# Patient Record
Sex: Male | Born: 1935 | Race: White | Hispanic: No | Marital: Married | Smoking: Never smoker
Health system: Southern US, Community
[De-identification: ages and names within clinical notes are randomized; demographics above are authoritative.]

## PROBLEM LIST (undated history)

## (undated) DIAGNOSIS — F419 Anxiety disorder, unspecified: Secondary | ICD-10-CM

## (undated) DIAGNOSIS — N289 Disorder of kidney and ureter, unspecified: Secondary | ICD-10-CM

## (undated) DIAGNOSIS — I8500 Esophageal varices without bleeding: Secondary | ICD-10-CM

## (undated) DIAGNOSIS — K219 Gastro-esophageal reflux disease without esophagitis: Secondary | ICD-10-CM

## (undated) DIAGNOSIS — F329 Major depressive disorder, single episode, unspecified: Secondary | ICD-10-CM

## (undated) DIAGNOSIS — F32A Depression, unspecified: Secondary | ICD-10-CM

## (undated) DIAGNOSIS — K7581 Nonalcoholic steatohepatitis (NASH): Secondary | ICD-10-CM

## (undated) DIAGNOSIS — G4730 Sleep apnea, unspecified: Secondary | ICD-10-CM

## (undated) DIAGNOSIS — D649 Anemia, unspecified: Secondary | ICD-10-CM

## (undated) DIAGNOSIS — E119 Type 2 diabetes mellitus without complications: Secondary | ICD-10-CM

## (undated) DIAGNOSIS — I10 Essential (primary) hypertension: Secondary | ICD-10-CM

## (undated) HISTORY — PX: TONSILLECTOMY: SUR1361

---

## 1982-01-13 HISTORY — PX: OTHER SURGICAL HISTORY: SHX169

## 1998-06-21 ENCOUNTER — Encounter: Payer: Self-pay | Admitting: Family Medicine

## 1998-06-21 ENCOUNTER — Ambulatory Visit (HOSPITAL_COMMUNITY): Admission: RE | Admit: 1998-06-21 | Discharge: 1998-06-21 | Payer: Self-pay | Admitting: Family Medicine

## 1998-08-24 ENCOUNTER — Ambulatory Visit (HOSPITAL_COMMUNITY): Admission: RE | Admit: 1998-08-24 | Discharge: 1998-08-24 | Payer: Self-pay | Admitting: Cardiology

## 2000-06-11 ENCOUNTER — Encounter: Admission: RE | Admit: 2000-06-11 | Discharge: 2000-06-11 | Payer: Self-pay | Admitting: Family Medicine

## 2000-06-11 ENCOUNTER — Encounter: Payer: Self-pay | Admitting: Family Medicine

## 2001-03-18 ENCOUNTER — Other Ambulatory Visit: Admission: RE | Admit: 2001-03-18 | Discharge: 2001-03-18 | Payer: Self-pay | Admitting: Family Medicine

## 2001-08-02 ENCOUNTER — Ambulatory Visit (HOSPITAL_COMMUNITY): Admission: RE | Admit: 2001-08-02 | Discharge: 2001-08-02 | Payer: Self-pay | Admitting: Family Medicine

## 2001-08-02 ENCOUNTER — Encounter: Payer: Self-pay | Admitting: Family Medicine

## 2001-08-03 ENCOUNTER — Inpatient Hospital Stay (HOSPITAL_COMMUNITY): Admission: EM | Admit: 2001-08-03 | Discharge: 2001-08-09 | Payer: Self-pay | Admitting: Emergency Medicine

## 2001-08-03 ENCOUNTER — Encounter (INDEPENDENT_AMBULATORY_CARE_PROVIDER_SITE_OTHER): Payer: Self-pay | Admitting: *Deleted

## 2001-08-06 ENCOUNTER — Encounter: Payer: Self-pay | Admitting: Internal Medicine

## 2001-08-07 ENCOUNTER — Encounter: Payer: Self-pay | Admitting: Internal Medicine

## 2001-08-17 ENCOUNTER — Ambulatory Visit (HOSPITAL_COMMUNITY): Admission: RE | Admit: 2001-08-17 | Discharge: 2001-08-17 | Payer: Self-pay | Admitting: Family Medicine

## 2001-08-17 ENCOUNTER — Encounter: Payer: Self-pay | Admitting: Family Medicine

## 2001-08-24 ENCOUNTER — Encounter: Payer: Self-pay | Admitting: Gastroenterology

## 2001-08-24 ENCOUNTER — Ambulatory Visit (HOSPITAL_COMMUNITY): Admission: RE | Admit: 2001-08-24 | Discharge: 2001-08-24 | Payer: Self-pay | Admitting: Gastroenterology

## 2001-09-27 ENCOUNTER — Encounter: Payer: Self-pay | Admitting: *Deleted

## 2001-09-27 ENCOUNTER — Ambulatory Visit (HOSPITAL_COMMUNITY): Admission: RE | Admit: 2001-09-27 | Discharge: 2001-09-27 | Payer: Self-pay | Admitting: *Deleted

## 2001-12-06 ENCOUNTER — Encounter: Payer: Self-pay | Admitting: Pulmonary Disease

## 2001-12-06 ENCOUNTER — Ambulatory Visit (HOSPITAL_BASED_OUTPATIENT_CLINIC_OR_DEPARTMENT_OTHER): Admission: RE | Admit: 2001-12-06 | Discharge: 2001-12-06 | Payer: Self-pay | Admitting: Internal Medicine

## 2002-02-24 ENCOUNTER — Encounter (INDEPENDENT_AMBULATORY_CARE_PROVIDER_SITE_OTHER): Payer: Self-pay

## 2002-02-24 ENCOUNTER — Ambulatory Visit (HOSPITAL_COMMUNITY): Admission: RE | Admit: 2002-02-24 | Discharge: 2002-02-24 | Payer: Self-pay | Admitting: Oncology

## 2002-04-28 ENCOUNTER — Ambulatory Visit (HOSPITAL_COMMUNITY): Admission: RE | Admit: 2002-04-28 | Discharge: 2002-04-28 | Payer: Self-pay | Admitting: Gastroenterology

## 2002-04-28 ENCOUNTER — Encounter: Payer: Self-pay | Admitting: Gastroenterology

## 2002-04-28 ENCOUNTER — Encounter (INDEPENDENT_AMBULATORY_CARE_PROVIDER_SITE_OTHER): Payer: Self-pay | Admitting: *Deleted

## 2003-03-07 ENCOUNTER — Ambulatory Visit (HOSPITAL_COMMUNITY): Admission: RE | Admit: 2003-03-07 | Discharge: 2003-03-07 | Payer: Self-pay | Admitting: Gastroenterology

## 2003-05-18 IMAGING — CT CT ABDOMEN W/ CM
1 series · 15 of 32 positions shown, 19 images · IV contrast (omnipaque)
Comparison: none

FINDINGS
CLINICAL DATA: SHORTNESS OF BREATH.  HISTORY OF CHF.  PERSISTENT FEVER.  PLEURAL EFFUSION.
QUESTION OF CIRRHOTIC LIVER ON CHEST CT SCAN.
CT SCAN OF THE ABDOMEN WITH IV CONTRAST
AXIAL CUTS WERE OBTAINED FOLLOWING ORAL GASTROGRAFIN INGESTION AND DURING IV INFUSION OF 150 CC OF
OMNIPAQUE 300.  IT WAS APPROPRIATE TO UTILIZE OMNIPAQUE 300 BECAUSE OF REPORTED HISTORY OF DIABETES
AND HYPERTENSION.
BILATERAL PLEURAL EFFUSIONS LARGER ON THE LEFT THAN ON THE RIGHT.  NO PERICARDIAL EFFUSION.  LEFT
LOWER LOBAR ATELECTASIS.  PARTIAL RIGHT LOWER LOBAR ATELECTASIS.  CALCIFIC DENSITY IS NOTED IN THE
LEFT CORONARY ARTERY SYSTEM INCLUDING LAD AND CIRCUMFLEX BRANCHES.  FAIRLY SUBTLE FINDING IS
LOBULATED CONTOUR OF THE LIVER BEST APPRECIATED ALONG THE POSTERIOR ASPECT OF THE RIGHT HEPATIC
LOBE.  MILD CAUDATE LOBE HYPERTROPHY.  MILD HEPATOMEGALY.  MILD SPLENOMEGALY.  THERE MAY BE MINIMAL
FATTY INFILTRATION OF THE LIVER.  SPLENIC VEIN STRUCTURES APPEAR SOMEWHAT PROMINENT IN CALIBER.
OVERALL I FEEL THAT THE FINDINGS ARE COMPATIBLE WITH CIRRHOSIS WITH PORTAL HYPERTENSION.  NO
EVIDENCE OF VENOUS THROMBOSIS.  NO ASCITES.  I AM CONCERNED THAT THE PANCREATIC HEAD IS ABNORMALLY
ENLARGED POSSIBLY DUE TO PANCREATITIS ALTHOUGH PANCREATIC TUMOR CANNOT BE EXCLUDED WITH CERTAINTY.
CORRELATE WITH AMYLASE/LIPASE.  SMALL AIR FILLED STRUCTURE ALONG THE POSTEROLATERAL ASPECT OF THE
PANCREATIC HEAD MAY REPRESENT A SMALL AMOUNT OF AIR IN THE BILIARY DUCT OR POSSIBLY A DUODENAL
DIVERTICULUM.  SMALL FOCAL DENSITY IS NOTED AT THAT SITE AS WELL AND MAY REPRESENT GASTROGRAFIN IN
A DUODENAL DIVERTICULUM OR POSSIBLY IN THE DISTAL DUCT.  NO DEFINITE DUCTAL STONES.
IMPRESSION
CIRRHOSIS WITH PORTAL HYPERTENSION.  SUSPICION FOR ENLARGED PANCREATIC HEAD.  PLEURAL EFFUSIONS.
LEFT LOWER LOBAR ATELECTASIS.  CORONARY ARTERY DISEASE.
PELVIC CT SCAN WITH IV CONTRAST
AXIAL CUTS WERE OBTAINED FOLLOWING ORAL GASTROGRAFIN INGESTION AND IV INFUSION OF 100 CC OF
OMNIPAQUE 300.  SIGMOID COLON DIVERTICULA.  LARGE AMOUNT OF STOOL IN THE COLON.  NO DEFINITE
EVIDENCE OF DIVERTICULITIS.  UNREMARKABLE APPEARANCE OF THE BLADDER AND PROSTATE.
SIGMOID DIVERTICULAR DISEASE.  OTHERWISE NEGATIVE PELVIC CT SCAN.

[Series 2: abd pelvis · axial · 0.80mm/px · z∈[-455,-30]mm · 15 of 125 slices shown, 19 images]
[im 9/125  soft-tissue]
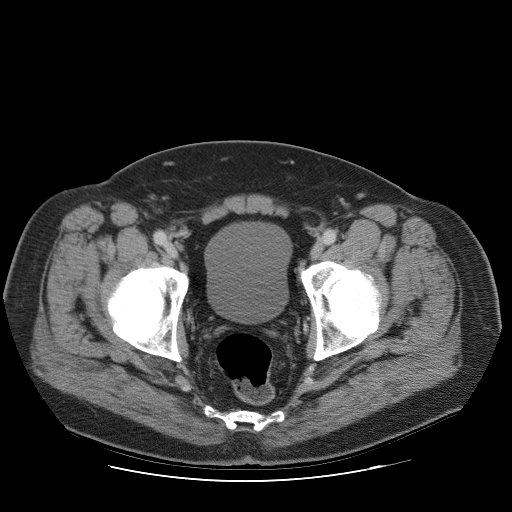
[im 9/125  bone]
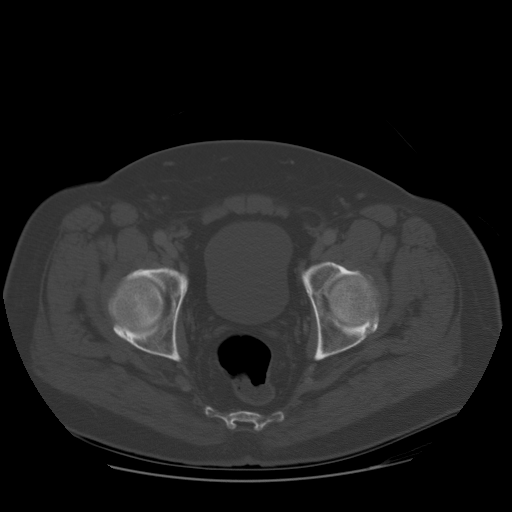
[im 17/125  soft-tissue]
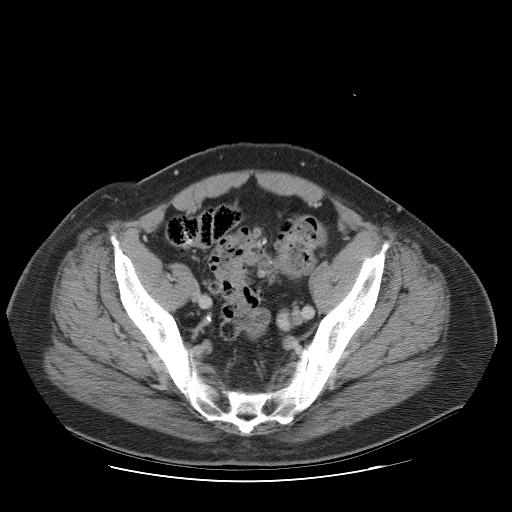
[im 25/125  soft-tissue]
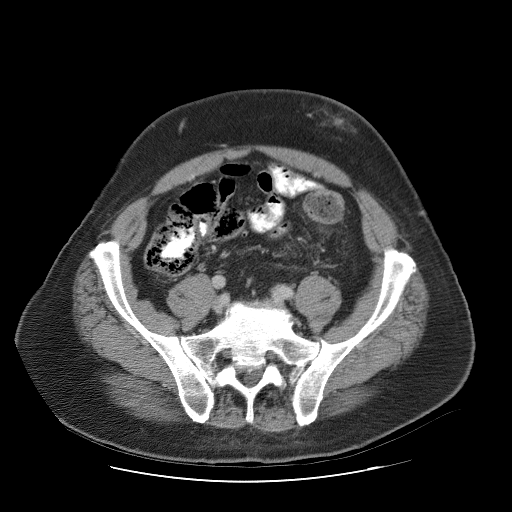
[im 37/125  soft-tissue]
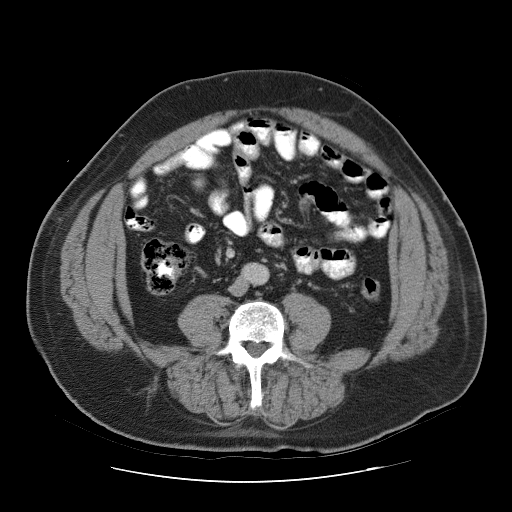
[im 45/125  soft-tissue]
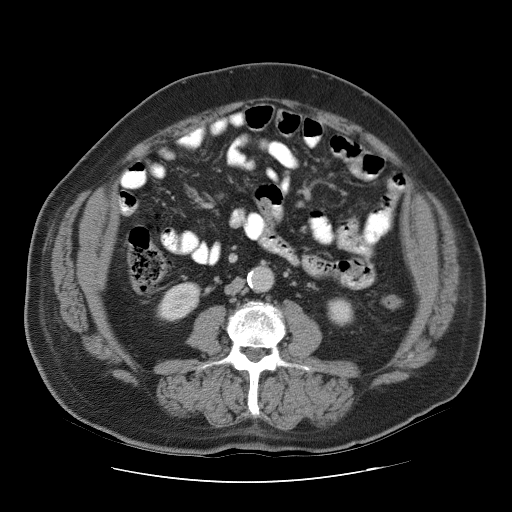
[im 53/125  soft-tissue]
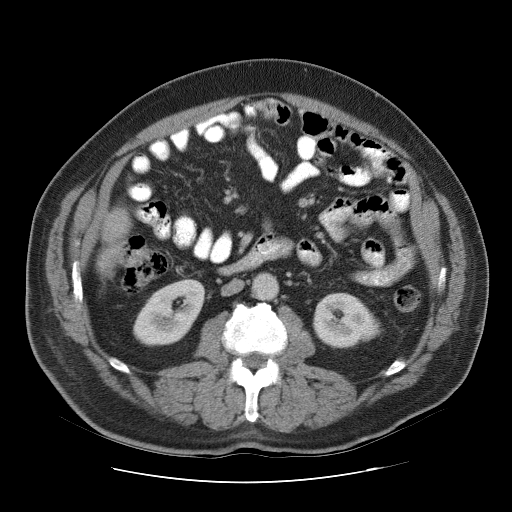
[im 65/125  soft-tissue]
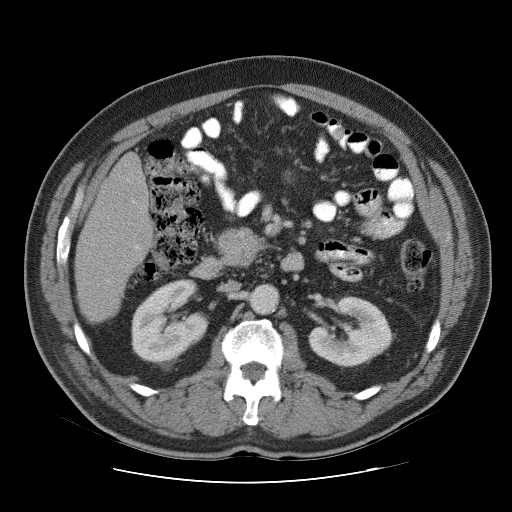
[im 73/125  soft-tissue]
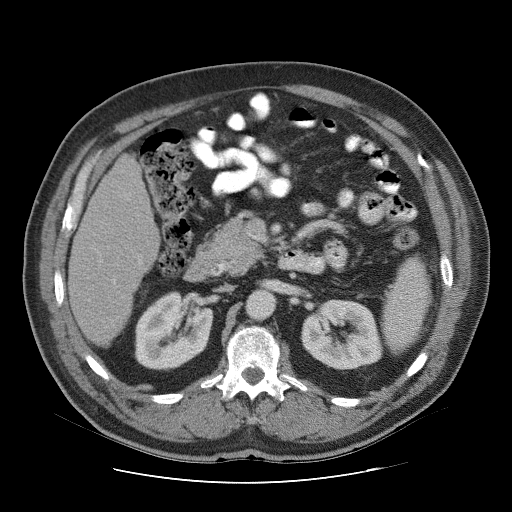
[im 81/125  soft-tissue]
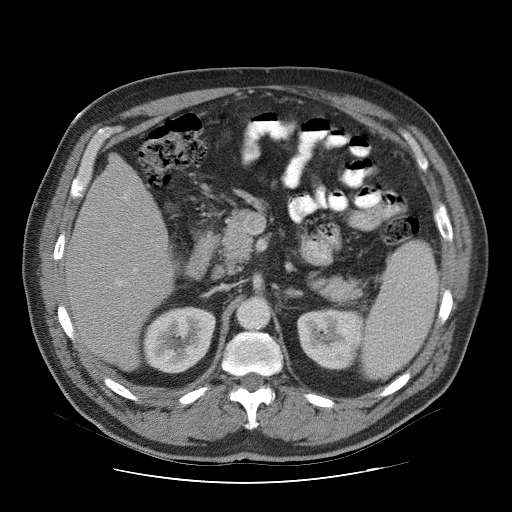
[im 81/125  bone]
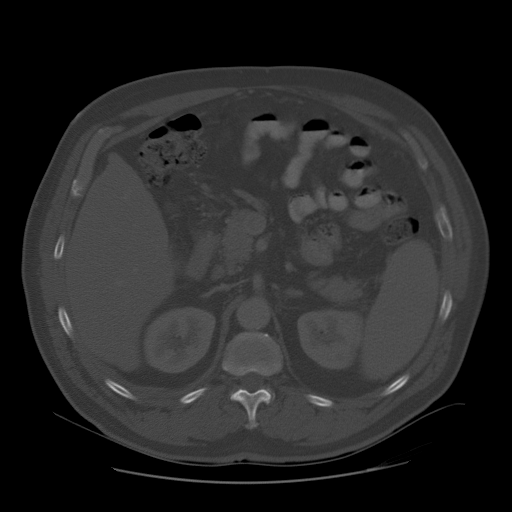
[im 89/125  soft-tissue]
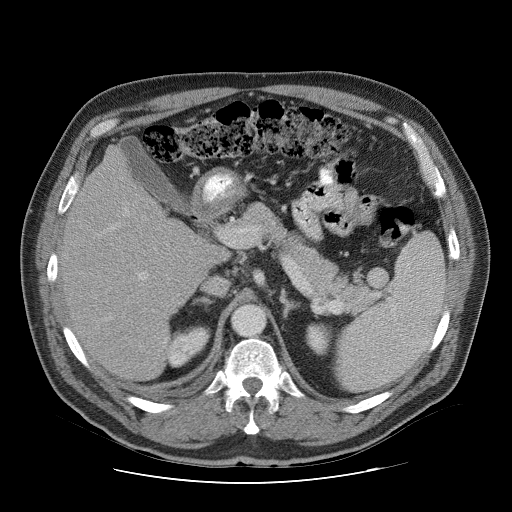
[im 101/125  soft-tissue]
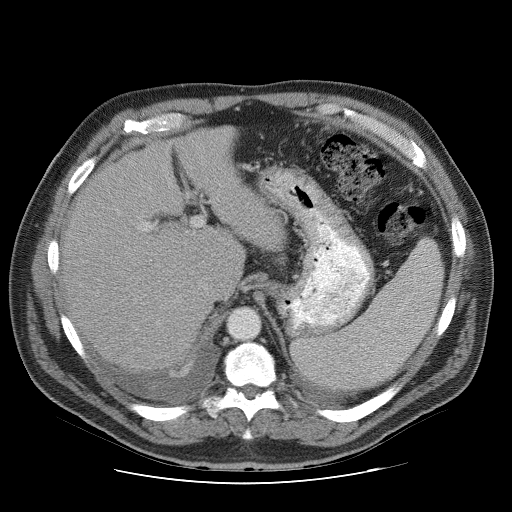
[im 109/125  soft-tissue]
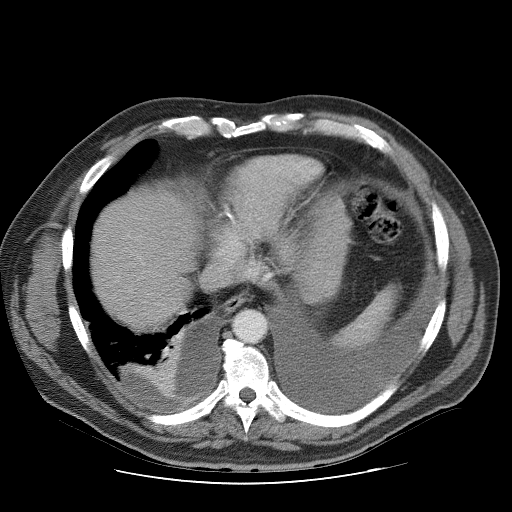
[im 109/125  lung]
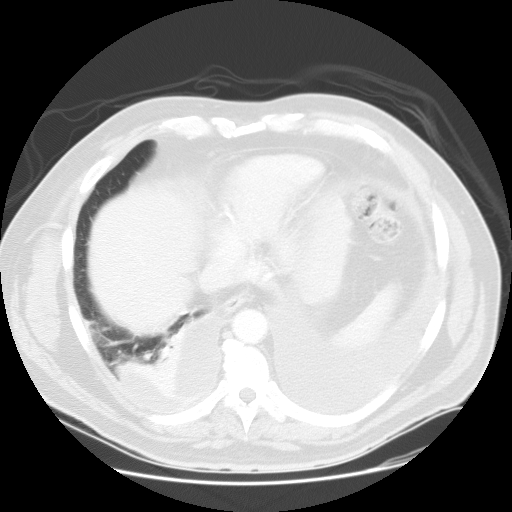
[im 113/125  lung]
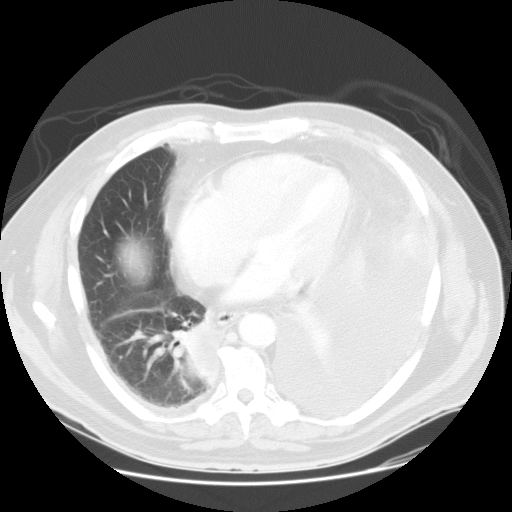
[im 117/125  soft-tissue]
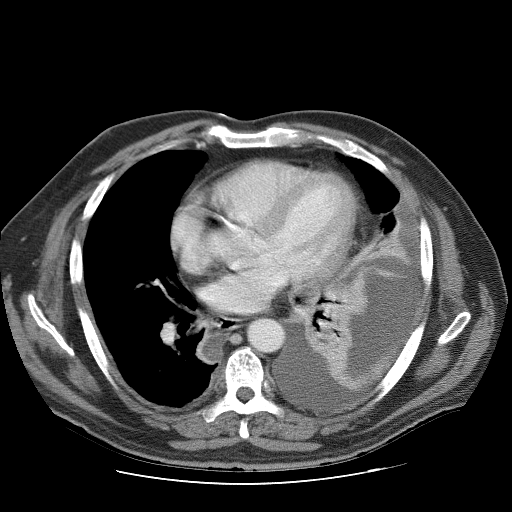
[im 117/125  lung]
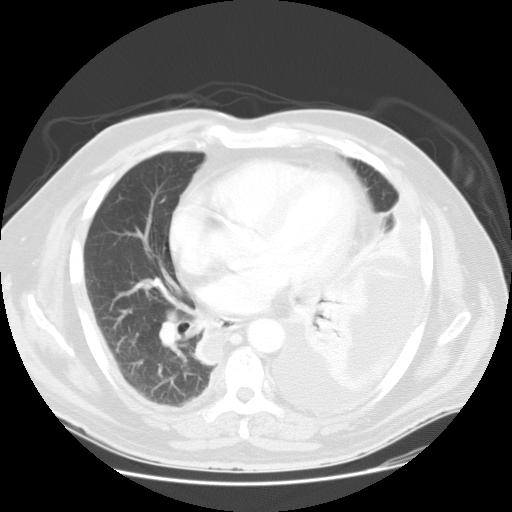
[im 121/125  lung]
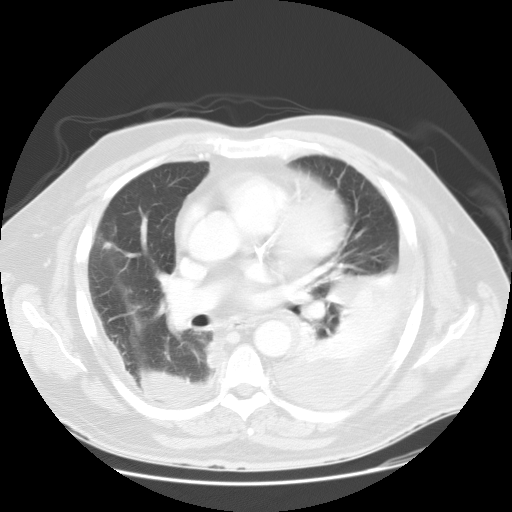

[15 of 32 positions shown; findings below may reference images not displayed]

## 2003-09-26 ENCOUNTER — Encounter: Admission: RE | Admit: 2003-09-26 | Discharge: 2003-09-26 | Payer: Self-pay | Admitting: Gastroenterology

## 2004-02-26 ENCOUNTER — Ambulatory Visit: Payer: Self-pay | Admitting: Oncology

## 2004-08-26 ENCOUNTER — Ambulatory Visit: Payer: Self-pay | Admitting: Oncology

## 2005-02-06 ENCOUNTER — Encounter: Admission: RE | Admit: 2005-02-06 | Discharge: 2005-02-06 | Payer: Self-pay | Admitting: Gastroenterology

## 2005-02-25 ENCOUNTER — Ambulatory Visit: Payer: Self-pay | Admitting: Oncology

## 2005-08-11 ENCOUNTER — Ambulatory Visit: Payer: Self-pay | Admitting: Cardiology

## 2005-08-15 ENCOUNTER — Ambulatory Visit: Payer: Self-pay

## 2005-09-09 ENCOUNTER — Ambulatory Visit: Payer: Self-pay | Admitting: Cardiology

## 2005-09-18 ENCOUNTER — Ambulatory Visit: Payer: Self-pay | Admitting: Internal Medicine

## 2005-09-24 ENCOUNTER — Ambulatory Visit (HOSPITAL_BASED_OUTPATIENT_CLINIC_OR_DEPARTMENT_OTHER): Admission: RE | Admit: 2005-09-24 | Discharge: 2005-09-24 | Payer: Self-pay | Admitting: Internal Medicine

## 2005-09-24 ENCOUNTER — Encounter: Payer: Self-pay | Admitting: Pulmonary Disease

## 2005-10-02 ENCOUNTER — Ambulatory Visit: Payer: Self-pay | Admitting: Pulmonary Disease

## 2005-11-06 ENCOUNTER — Ambulatory Visit: Payer: Self-pay | Admitting: Internal Medicine

## 2005-11-07 ENCOUNTER — Ambulatory Visit: Payer: Self-pay | Admitting: Internal Medicine

## 2006-07-24 ENCOUNTER — Ambulatory Visit (HOSPITAL_COMMUNITY): Admission: RE | Admit: 2006-07-24 | Discharge: 2006-07-24 | Payer: Self-pay | Admitting: Family Medicine

## 2006-09-24 ENCOUNTER — Emergency Department (HOSPITAL_COMMUNITY): Admission: EM | Admit: 2006-09-24 | Discharge: 2006-09-24 | Payer: Self-pay | Admitting: Emergency Medicine

## 2007-04-02 ENCOUNTER — Observation Stay (HOSPITAL_COMMUNITY): Admission: EM | Admit: 2007-04-02 | Discharge: 2007-04-03 | Payer: Self-pay | Admitting: Emergency Medicine

## 2007-04-02 ENCOUNTER — Ambulatory Visit: Payer: Self-pay | Admitting: Cardiology

## 2007-04-03 ENCOUNTER — Encounter (INDEPENDENT_AMBULATORY_CARE_PROVIDER_SITE_OTHER): Payer: Self-pay | Admitting: Internal Medicine

## 2007-04-12 ENCOUNTER — Ambulatory Visit: Payer: Self-pay | Admitting: Cardiology

## 2007-07-06 DIAGNOSIS — I359 Nonrheumatic aortic valve disorder, unspecified: Secondary | ICD-10-CM | POA: Insufficient documentation

## 2007-07-06 DIAGNOSIS — E785 Hyperlipidemia, unspecified: Secondary | ICD-10-CM | POA: Insufficient documentation

## 2007-07-06 DIAGNOSIS — E40 Kwashiorkor: Secondary | ICD-10-CM | POA: Insufficient documentation

## 2007-07-06 DIAGNOSIS — K7689 Other specified diseases of liver: Secondary | ICD-10-CM | POA: Insufficient documentation

## 2007-07-06 DIAGNOSIS — E669 Obesity, unspecified: Secondary | ICD-10-CM | POA: Insufficient documentation

## 2007-07-07 ENCOUNTER — Ambulatory Visit: Payer: Self-pay | Admitting: Pulmonary Disease

## 2007-07-07 DIAGNOSIS — G4733 Obstructive sleep apnea (adult) (pediatric): Secondary | ICD-10-CM

## 2008-02-10 ENCOUNTER — Ambulatory Visit (HOSPITAL_COMMUNITY): Admission: RE | Admit: 2008-02-10 | Discharge: 2008-02-10 | Payer: Self-pay | Admitting: Gastroenterology

## 2008-02-17 ENCOUNTER — Ambulatory Visit (HOSPITAL_COMMUNITY): Admission: RE | Admit: 2008-02-17 | Discharge: 2008-02-17 | Payer: Self-pay | Admitting: Gastroenterology

## 2008-03-09 ENCOUNTER — Ambulatory Visit (HOSPITAL_COMMUNITY): Admission: RE | Admit: 2008-03-09 | Discharge: 2008-03-09 | Payer: Self-pay | Admitting: Gastroenterology

## 2009-03-12 ENCOUNTER — Encounter: Admission: RE | Admit: 2009-03-12 | Discharge: 2009-03-12 | Payer: Self-pay | Admitting: Gastroenterology

## 2009-06-12 ENCOUNTER — Telehealth: Payer: Self-pay | Admitting: Pulmonary Disease

## 2009-06-13 ENCOUNTER — Ambulatory Visit: Payer: Self-pay | Admitting: Pulmonary Disease

## 2009-07-11 ENCOUNTER — Encounter: Payer: Self-pay | Admitting: Pulmonary Disease

## 2009-08-06 ENCOUNTER — Encounter: Payer: Self-pay | Admitting: Pulmonary Disease

## 2010-02-12 NOTE — Letter (Signed)
Summary: CMN request for CPAP Supplies/[facility]  CMN request for CPAP Supplies/[facility]   Imported By: Sherian Rein 07/18/2009 13:18:34  _____________________________________________________________________  External Attachment:    Type:   Image     Comment:   External Document

## 2010-02-12 NOTE — Letter (Signed)
Summary: LMN/[facility]  LMN/[facility]   Imported By: Lester Monmouth 06/19/2009 11:54:17  _____________________________________________________________________  External Attachment:    Type:   Image     Comment:   External Document

## 2010-02-12 NOTE — Progress Notes (Signed)
Summary: prescript  Phone Note Call from Patient   Caller: Spouse Call For: [name] Summary of Call: need prescript for humidifer for c pap machine sent to [facility] Initial call taken by: Rickard Patience,  Jun 12, 2009 10:44 AM  Follow-up for Phone Call        Pt last seen 2009, appt scheduled 06/13/2009 with [facility] @ 11 am. Zackery Barefoot CMA  Jun 12, 2009 11:35 AM

## 2010-02-12 NOTE — Assessment & Plan Note (Signed)
Summary: rov for osa, cpap issues.   CC:  Pt is here for an overdue f/u appt . Pt was last seen June 2009.   Pt states he wears his cpap machine every night.  Approx 6 hours per night.  Pt states he also wears cpap while taking naps during the day.  Pt request rx for a new humidifier.  Marland Kitchen  History of Present Illness: the pt comes in today for f/u of his known osa.  He has not been seen since 2009, but at least is wearing cpap compliantly.  He is having issues with his machine not starting up on occasion, and reports that his humidifier is malfunctioning as well.  His current machine is near 73 yrs old, the typical life of a cpap machine.  He is having no issue with mask fit or leak.  He feels that he is sleeping well, and is satisfied with his daytime alertness.  Current Medications (verified): 1)  Fish Oil 1000 Mg  Caps (Omega-3 Fatty Acids) .Marland Kitchen.. 1 By Mouth Two Times A Day 2)  Multivitamins   Tabs (Multiple Vitamin) .Marland Kitchen.. 1 By Mouth Daily 3)  Bayer Aspirin 325 Mg  Tabs (Aspirin) .Marland Kitchen.. 1 By Mouth Daily 4)  Cozaar 100 Mg Tabs (Losartan Potassium) .... Take 1 Tablet By Mouth Once A Day 5)  Gemfibrozil 600 Mg  Tabs (Gemfibrozil) .Marland Kitchen.. 1 By Mouth Two Times A Day 6)  Venlafaxine Hcl 75 Mg  Tabs (Venlafaxine Hcl) .Marland Kitchen.. 1 By Mouth Two Times A Day 7)  Lasix 40 Mg  Tabs (Furosemide) .... Take 1 Tablet By Mouth Once A Day 8)  Ketoconazole 2 %  Crea (Ketoconazole) .... As Directed 9)  Eucerin   Crea (Skin Protectants, Misc.) .... As Directed 10)  Amiloride Hcl 5 Mg  Tabs (Amiloride Hcl) .... Take 1 Tablet By Mouth Once A Day ( Unsure of Dosage...either 5mg  or 10mg ) 11)  Levemir 100 Unit/ml Soln (Insulin Detemir) .... Use As Directed 12)  Align  Caps (Probiotic Product) .... Take 1 Tablet By Mouth Once A Day 13)  Protonix 40 Mg Tbec (Pantoprazole Sodium) .... Take 1 Tablet By Mouth Once A Day  Allergies (verified): No Known Drug Allergies  Review of Systems  The patient denies shortness of breath with  activity, shortness of breath at rest, productive cough, non-productive cough, coughing up blood, chest pain, irregular heartbeats, acid heartburn, indigestion, loss of appetite, weight change, abdominal pain, difficulty swallowing, sore throat, tooth/dental problems, headaches, nasal congestion/difficulty breathing through nose, sneezing, itching, ear ache, anxiety, depression, hand/feet swelling, joint stiffness or pain, rash, change in color of mucus, and fever.    Vital Signs:  Patient profile:   75 year old male Height:      68 inches Weight:      233.31 pounds BMI:     35.60 O2 Sat:      96 % on Room air Temp:     97.6 degrees F oral Pulse rate:   57 / minute BP sitting:   118 / 66  (left arm) Cuff size:   large  Vitals Entered By: Arman Filter LPN (June 13, 1608 11:16 AM)  O2 Flow:  Room air CC: Pt is here for an overdue f/u appt . Pt was last seen June 2009.   Pt states he wears his cpap machine every night.  Approx 6 hours per night.  Pt states he also wears cpap while taking naps during the day.  Pt request rx for  a new humidifier.   Comments Medications reviewed with patient Arman Filter LPN  June 13, 1608 11:17 AM    Physical Exam  General:  ow male in nad Nose:  no skin breakdown or pressure necrosis from cpap mask.  Nares are patent with no crusting. Mouth:  clear Lungs:  clear Extremities:  mild edema with varicosities, no cyanosis Neurologic:  alert, not sleepy,moves all 4.   Impression & Recommendations:  Problem # 1:  OBSTRUCTIVE SLEEP APNEA (ICD-327.23) the pt has not been seen in 2 years, and is having issues with his cpap machine.  I suspect he needs a new device by his description, and will try to go thru insurance company to authorize this.  I have also reminded him to keep up with cpap supplies and mask changes, and to let us know if having issues.  Finally, I have asked him to work on weight loss.  Medications Added to Medication List This Visit: 1)   Cozaar 100 Mg Tabs (Losartan potassium) .... Take 1 tablet by mouth once a day 2)  Levemir 100 Unit/ml Soln (Insulin detemir) .... Use as directed 3)  Align Caps (Probiotic product) .... Take 1 tablet by mouth once a day 4)  Protonix 40 Mg Tbec (Pantoprazole sodium) .... Take 1 tablet by mouth once a day  Other Orders: Est. Patient Level IV (96045) DME Referral (DME)  Patient Instructions: 1)  will see if we can get you a new machine, or at least a new humidifier. 2)  work on weight loss 3)  followup with me in one year, but call if we are not getting your equipment issues sorted out.

## 2010-02-20 ENCOUNTER — Other Ambulatory Visit: Payer: Self-pay | Admitting: Gastroenterology

## 2010-02-20 DIAGNOSIS — K7460 Unspecified cirrhosis of liver: Secondary | ICD-10-CM

## 2010-02-26 ENCOUNTER — Ambulatory Visit
Admission: RE | Admit: 2010-02-26 | Discharge: 2010-02-26 | Disposition: A | Discharge status: Home / Self Care | Payer: BC Managed Care – PPO | Source: Ambulatory Visit | Attending: Gastroenterology | Admitting: Gastroenterology

## 2010-02-26 DIAGNOSIS — K7460 Unspecified cirrhosis of liver: Secondary | ICD-10-CM

## 2010-04-30 LAB — TYPE AND SCREEN
ABO/RH(D): A POS
Antibody Screen: NEGATIVE

## 2010-04-30 LAB — DIFFERENTIAL
Basophils Absolute: 0 10*3/uL (ref 0.0–0.1)
Lymphocytes Relative: 25 % (ref 12–46)
Neutro Abs: 1.5 10*3/uL — ABNORMAL LOW (ref 1.7–7.7)
Neutrophils Relative %: 53 % (ref 43–77)

## 2010-04-30 LAB — CBC
HCT: 35.6 % — ABNORMAL LOW (ref 39.0–52.0)
MCV: 93.8 fL (ref 78.0–100.0)
Platelets: 99 10*3/uL — ABNORMAL LOW (ref 150–400)
WBC: 2.9 10*3/uL — ABNORMAL LOW (ref 4.0–10.5)

## 2010-04-30 LAB — COMPREHENSIVE METABOLIC PANEL
Albumin: 3.1 g/dL — ABNORMAL LOW (ref 3.5–5.2)
BUN: 17 mg/dL (ref 6–23)
CO2: 24 mEq/L (ref 19–32)
Chloride: 102 mEq/L (ref 96–112)
Creatinine, Ser: 1.08 mg/dL (ref 0.4–1.5)
GFR calc non Af Amer: >60 mL/min (ref >60)
Glucose, Bld: 168 mg/dL — ABNORMAL HIGH (ref 70–99)
Total Bilirubin: 0.9 mg/dL (ref 0.3–1.2)

## 2010-04-30 LAB — ABO/RH: ABO/RH(D): A POS

## 2010-05-28 NOTE — Op Note (Signed)
NAME:  [name], [name]               ACCOUNT NO.:  [id number]   MEDICAL RECORD NO.:  [id number]          PATIENT TYPE:  AMB   LOCATION:  ENDO                         FACILITY:  [facility]   PHYSICIAN:  Bernette Redbird, M.D.   DATE OF BIRTH:  03-03-1935   DATE OF PROCEDURE:  03/09/2008  DATE OF DISCHARGE:                               OPERATIVE REPORT   PROCEDURE:  Upper endoscopy.   INDICATION:  A 75 year old gentleman with nonalcoholic steatohepatitis  for updated evaluation of the status of his esophageal varices, with  possible banding if they are of sufficient size to warrant that.   FINDINGS:  Small to medium distal esophageal varices, not banded.  Significant portal gastropathy.   PROCEDURE IN DETAIL:  The nature, purpose, risks of the procedure had  been discussed with the patient who provided written consent.  Sedation  was propofol by anesthesia.  There was a moderate amount of desaturation  during the procedure with obstructive sounding upper airway sounds,  consistent with obstructive sleep apnea, but he was not clinically  unstable throughout the procedure.   The Pentax adult video endoscope was passed under direct vision,  entering the esophagus without significant difficulty.   The proximal esophagus was normal but the distal esophagus did have  small varices.  The size of them varied according to the degree of  insufflation and relaxation of the esophagus.  In particular, at the 2  o'clock position just above the lower esophageal sphincter, one varix  was quite prominent and appeared to be about 3+ in size when the  esophagus was contracted, but it flattened out to 1 or 2+ (small) size  with relaxation of the esophagus and with further insufflation, so I  elected not to band.  The varices probably extended about 4 cm up the  esophagus from the level of the lower esophageal sphincter.   The stomach contained no blood or coffee-ground material but had patchy  erythema and  a few minimal erosions.  The overall picture was suggestive  of portal gastropathy, with a sort of mosaic pattern to the mucosa.  The  erosive changes are attributed to the fact that the patient is on  aspirin and apparently is not currently on proton pump inhibitor  therapy.  The pylorus, duodenal bulb and second duodenum looked normal.  A retroflexed view of the cardia did not show any evidence of gastric  varices or other abnormalities other than portal gastropathy, and the  larynx looked normal during removal of the scope.   The patient tolerated the procedure well and there were no apparent  complications.   IMPRESSION:  1. Small, nonbleeding esophageal varices, not significantly changed in      size from the time of his last endoscopy 2 years ago.  If anything,      they might even be a little bit smaller than before.  2. Portal gastropathy.  3. Mild erosive gastritis consistent with aspirin exposure.   PLAN:  1. Proton pump inhibitor therapy to counter the effect of aspirin on      the stomach lining.  2.  Repeat endoscopy in 1 year to monitor the varices.           ______________________________  Bernette Redbird, M.D.     RB/MEDQ  D:  03/09/2008  T:  03/10/2008  Job:  [id number]   cc:   Dr Bonnee Quin  fax [phone]   Holley Bouche, M.D.  Fax: [phone]

## 2010-05-28 NOTE — Assessment & Plan Note (Signed)
[facility]                            CARDIOLOGY OFFICE NOTE   NAME:[name], [name]                      MRN:          [id number]  DATE:04/12/2007                            DOB:          11-16-1935    Mr. Derousse has mild aortic valvular disease.  He had had an episode of  hypotension and was hospitalized at [facility].  There is a very careful  and complete discharge summary.  It was felt that he did have syncope or  presyncope and it was probably related to some decrease in blood  pressure.  He does have some aortic stenosis but this is not the basis  of any severe ischemia alone.  He has had fevers and he has had a  careful workup and the exact etiology is not clear.  He has had an echo  done and read by [place].  This study was done on April 03, 2007.  The study  shows that he has good left ventricular function with an ejection  fraction of 55-65%.  There is calcification of the right coronary cusp.  The findings are compatible with mild aortic stenosis.   In the office today, he has not had any more shortness of breath. There  has been no more syncope.  The patient is here with his wife.  He is  oriented to person, time and place.   PAST MEDICAL HISTORY:  No known drug allergies.   MEDICATIONS:  Fish oil, multivitamin, aspirin, Avapro, propranolol,  gemfibrozil, Niaspan, Lasix.  Metformin, glyburide, Xifaxan and  colchicine.   OTHER MEDICAL PROBLEMS:  See the list below.   REVIEW OF SYSTEMS:  He feels weak but he is improving.  Otherwise his  review of systems is negative.   PHYSICAL EXAMINATION:  Weight is 229 pounds which actually has decreased  on our scale over the years.  Blood pressure is 140/80 with a pulse of  95.  The patient is oriented to person, time and place.  Affect is normal.  HEENT:  Reveals no xanthelasma.  He has normal extraocular motion.  There are no carotid bruits.  There is no jugular venous distention.  LUNGS:  Clear.   Respiratory effort is not labored.  CARDIAC:  S1 with an S2.  There is a systolic murmur.  ABDOMEN:  Obese but soft.  He has no significant peripheral edema.   EKG reveals a sinus rhythm.   PROBLEM LIST:  1. Recent syncopal episode that is probably multifactorial.  He does      have some mild aortic stenosis but he did not have syncope on the      basis of aortic stenosis alone.  2. Some hypoxia that was multifactorial.  3. Diabetes.  4. Protein calorie malnutrition.  5. Steatohepatitis.  6. Hyperlipidemia.  7. Obesity.  8. Recent discomfort in his foot thought to be gout.  9. Pancytopenia historically related to cirrhosis and hypersplenism.  10.History of a GI bleed in the past.  11.Episodes in 2003 and 2004 of pleural effusion with no definite      diagnoses.  12.Difficulty  taking Elavil in the past.  13.History of some depression.  14.Ongoing generalized fatigue.  15.Recent fevers which have been evaluated and continued to be      evaluated.   Today, I carefully reviewed the patient's hospital information.  I had  also reviewed his 2-D echo.  I have discussed his overall situation with  the patient and his wife.  At this point,  he does have mild aortic  stenosis.  I believe it is not playing a significant role in his overall  status.  I am not recommending any further cardiac workup at this point.  I do suggest that he have a follow-up echo and a visit with me in 1  year.     Luis Abed, MD, [facility]  Electronically Signed    JDK/MedQ  DD: 04/12/2007  DT: 04/12/2007  Job #: [id number]   cc:   Melida Quitter, M.D.  Coral Ceo, MD @ [facility]  Bernette Redbird, M.D.  Demetria Pore. Coral Spikes, M.D.

## 2010-05-28 NOTE — Discharge Summary (Signed)
NAME:  [name], [name] NO.:  [id number]   MEDICAL RECORD NO.:  [id number]          PATIENT TYPE:  OBV   LOCATION:  1438                         FACILITY:  [facility]   PHYSICIAN:  Hollice Espy, M.D.DATE OF BIRTH:  [birth year]   DATE OF ADMISSION:  04/02/2007  DATE OF DISCHARGE:  04/03/2007                               DISCHARGE SUMMARY   PRIMARY CARE PHYSICIAN:  Dr. Leonides Sake.   CARDIOLOGIST:  Dr. Myrtis Ser, [facility].   GASTROENTEROLOGIST:  Dr. Matthias Hughs, [facility].   He also sees Dr. Coral Ceo of hepatology over at [facility].   DISCHARGE DIAGNOSES:  1. Syncope felt to be secondary to decreased blood pressure.  2. Episodes of hypotension, suspected possible aortic stenosis.  3. Hypoxia, multifactorial.  May be from aortic stenosis and/or sleep      apnea.  4. Diabetes mellitus with episodes of hyperglycemia.  5. Protein-calorie malnutrition from acute decreased appetite and      chronic liver disease.  6. Steatohepatitis.  7. Hyperlipidemia.  8. Obesity.  9. History of medical noncompliance.   DISCHARGE MEDICATIONS:  The patient is being advised to stop all of his  blood pressure medications.  These included Avapro 150 daily,  propranolol 20 mg p.o. b.i.d., Norvasc 5 p.o. daily, and to decrease his  Lasix from 40 b.i.d. to 20 b.i.d.   He will continue on the rest of his medications.  His full list  includes:  1. Fish oil b.i.d.  2. Multivitamin daily.  3. Aspirin 325 daily.  4. Lopid 600 b.i.d.  5. Niacin 500 daily.  6. Effexor 75 b.i.d.  7. Metformin 500 t.i.d.  8. Glyburide 5 daily.  9. Crestor a half a pill daily.  10.Xifaxan six pills a day.  11.Continue his topical creams:  Ketoconazole, Lotrisone and Eucerin.   HOSPITAL COURSE:  The patient is a 75 year old white male with past  medical history of steatohepatitis, obesity, obstructive sleep apnea,  diabetes mellitus and hypertension who presented with a syncopal  episode.   The patient recently been evaluated, had a recent attack of  gout, had recently stopped his medications briefly, and also been having  unexplained fevers, all of which were possible contributing factors.   1. In regard to the patient's episode of hypotension.  Reportedly he      was told by paramedics his blood pressure was low after his      syncopal event.  When he was brought here, initially his blood      pressures were moderate in the 110s.  His blood pressure medicine      was not continued.  He was put on low-dose IV fluids at 50 mL an      hour.  During his hospital course his blood pressure stayed around      anywhere from 90 to 100 at best.  This is off of his medications      and only in the afternoon did it increase slightly to 130.  His D-      dimer was elevated on admission but the CT scan of his  chest ended      up being negative for PE.  It did note some calcifications in the      area of the aortic valve and there was a concern about the      possibility of aortic stenosis.  I ordered an echocardiogram to be      done which is still pending and will be done prior to the patient's      discharge, which will be followed up by [facility].  When I      talked to the patient about the possibility of aortic stenosis this      evening, he was reminded that he had previously once seen a      cardiologist and told he might have some aortic stenosis.  He did      not pass on this information to me earlier or to his wife.  For      now, given the fact that he is otherwise stable and doing well, I      am advising him to after, his completion of his echocardiogram, he      will follow up next week with [facility] with the results.      In the meantime, I am stopping his blood pressure medications.  In      review of the literature there is some possible evidence that in      aortic stenosis, which we have not yet confirmed, an ACE inhibitor      may be beneficial.   However, recent studies have refuted this.  At      this time I am not comfortable sending him home on blood pressure      medications which may cause a syncopal event.  2. In regards to his diabetes mellitus, his A1c was actually elevated      at 8.2 with an average blood sugar of 216.  He is on metformin and      glyburide and I do not see any evidence of any severe hypoglycemic      episodes.  However, his p.o. intake has been poor as evidenced by      below.  I am recommending an increase in appetite continuing his      medication.  3. In regards to his protein-calorie malnutrition, his wife tells me      as of late with his multiple medical issues including his gout, his      unexplained fevers, that his appetite has been very poor.  Albumin      level is only 2.4 on admission.  In addition, he has a history of      chronic liver disease followed by [facility].  Will plan for      the patient to increase p.o. intake and for his PCP as well as Dr.      Ardine Eng to follow his albumin levels.  4. In regard to his fevers of unknown origin, the patient had been      reportedly having fevers at times at home as high as 102.  He had      had an extensive workup both by Dr. Tiburcio Pea and Dr. Ardine Eng and there      was a possibility about concerns of endocarditis.  The patient      exhibited no signs of fever during his hospitalization and he      showed no signs of infection.  He has a normal white count, no  shift, negative chest x-ray and urinalysis.  However, I ordered a      sedimentation rate which actually looked to be elevated at 58.  He      has had report of gout but I do not think that this is gout - see      below - and therefore I suspect the patient perhaps may have a mild      underlying rheumatologic condition which would account for      unexplained fevers.  He does not need further workup in the      hospital given the absence of other symptoms, but I am recommending       giving him the number for Dr. Coral Spikes, rheumatology, for the      patient to follow up as well.  5. In regards to his gout, serum uric acid level was checked and this      was found be stable.  He had a brief flareup earlier and his      symptoms now are minimal.  6. In regards to his hypertension, see above.  7. In regards to his obstructive sleep apnea, it was noted the patient      had an ABG done which showed a pO2 of only 70 on room air during      his hospitalization.  I was concerned about the possibility of      hypoxia and he was continued on his nightly CPAP, at times of which      he is noncompliant with.  I have urged the patient at night to      continue his CPAP as there may be the possibility he may be having      some mild hypoxemia during the day because of his aortic stenosis,      if that is indeed the diagnosis.  Will plan to continue to monitor      pending echocardiogram results.   The patient's otherwise overall disposition is improved from initial  presentation.  His activity will be slowly increased.  Discharge diet  will be a carbohydrate-modified heart-healthy diet and he will follow up  with Dr. Tiburcio Pea  in 1-2 weeks, Dr. Myrtis Ser of [facility] this week for followup on  echocardiogram, Dr. Coral Spikes of rheumatology - I have given him the  number, and Dr. Ardine Eng as he is next scheduled.  The patient is being  discharged to home.      Hollice Espy, M.D.  Electronically Signed     SKK/MEDQ  D:  04/03/2007  T:  04/03/2007  Job:  [id number]   cc:   Melida Quitter, M.D.  Fax: [phone]   Luis Abed, MD, [facility]  [address]  [address]  [place]  [place] [address]   Bernette Redbird, M.D.  Fax: [phone]   Coral Ceo, MD  [facility]   Demetria Pore. Coral Spikes, M.D.  Fax: [phone]

## 2010-05-28 NOTE — H&P (Signed)
NAME:  DILLIAN, FEIG NO.:  [id number]   MEDICAL RECORD NO.:  [id number]          PATIENT TYPE:  OBV   LOCATION:  0104                         FACILITY:  [facility]   PHYSICIAN:  Hollice Espy, M.D.DATE OF BIRTH:  31-Mar-1935   DATE OF ADMISSION:  04/02/2007  DATE OF DISCHARGE:                              HISTORY & PHYSICAL   PRIMARY CARE PHYSICIAN:  Holley Bouche, M.D.   CHIEF COMPLAINT:  Syncope.   HISTORY OF PRESENT ILLNESS:  The patient is a 75 year old, white male  with past medical history of steatohepatitis, obesity, diabetes and  hypertension who in no recent medical history has been having stopped  his medications a week ago for several days and then resumed them.  He  has been having episodes of unexplained fevers and had recent episode of  gout he had for the first time.  He otherwise now has said he has been  feeling okay.  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  He does not  think that he passed out, although some of his students did report that  he did pass out for a few minutes.  There was no report of any seizure-  like activity such as shaking, tremors, bowel or bladder incontinence.  The patient came to and was slightly disoriented.  He was brought into  the emergency room.  Reportedly at home, in the last 24 hours, he has  had fevers as high as 102.5 reported by his wife.  Supposedly, according  to the patient, the paramedics told him his blood pressure was low when  they checked it.  When he came into the emergency room, his blood  pressure was 126/60 with a heart rate of 69.  He had normal temperature,  normal O2 saturations and respiratory rate.  His chest x-ray was  unremarkable, although his D-dimer is slightly elevated so we did a CT  scan.  No blood clot was known, but a mild to moderate bibasilar  atelectasis and scarring were noted.  Cirrhosis and splenomegaly  were  seen and mild cardiomegaly with aortic valve calcifications were noted  as well.  The patient had an EKG done which showed normal sinus rhythm  and the rest of his lab work was essentially unremarkable with the  exception of some mild transaminitis and an albumin level of only 2.4.  Currently, the patient states he is feeling okay, just tired.  He denies  any headaches, vision changes, dysphagia, no chest pain, palpitations,  shortness of breath, wheezing or coughing, no abdominal pain, no  hematuria, dysuria, constipation, diarrhea focal extremity numbness,  weakness or pain.  His review of systems otherwise negative.   PAST MEDICAL HISTORY:  1. Steatohepatitis.  2. Hypertension.  3. Diabetes.  4. Obesity.  5. Sleep apnea.   MEDICATIONS:  1. Aspirin 325.  2. Avapro 150.  3. Effexor twice a day.  4. Fish oil.  5. Glipizide t.i.d.  6. Lasix 40 daily.  7. Metformin 500 t.i.d.  8. Multivitamin daily.  9. Niaspan.  10.Propranolol.  11.Xifaxan.  12.Cholesterol medication.   ALLERGIES:  No known drug allergies.   SOCIAL HISTORY:  He denies any tobacco, alcohol or illegal drug use.   FAMILY HISTORY:  Noncontributory.   PHYSICAL EXAMINATION:  VITAL SIGNS:  Temperature 97.7, heart rate 69,  blood pressure 126/60, respirations 16, O2 saturations 96% on room air.  GENERAL:  He is alert and oriented x3 in no apparent distress.  HEENT:  Normocephalic, atraumatic.  Mucous membranes are slightly dry.  He has no carotid bruits.  HEART:  Cranial nerves II-XII are intact.  HEART:  Regular rate and rhythm, S1 and S2.  He has a very soft 2/6  systolic ejection murmur.  LUNGS:  Decreased breath sounds at the bases.  Part of this may be  secondary to body habitus.  ABDOMEN:  Soft, obese, nontender, positive bowel sounds.  EXTREMITIES:  No clubbing, cyanosis, trace pitting edema.   LABORATORY DATA AND X-RAY FINDINGS:  Lab work, chest x-ray and CT are as  HPI.  EKG shows normal  sinus rhythm.  White count 7.8, H&H 12.4 and 36,  MCV 91, platelet count 110, no shift.  Sodium 132, potassium 4.8,  chloride 98, bicarb 27, BUN 15, creatinine 1.25, glucose 208.  LFTs are  noted for ALT 46, albumin 2.4.  D-dimer 1.83.  The rest of his  coagulations are unremarkable.   ASSESSMENT/PLAN:  1. Syncope.  2. History steatohepatitis.  3. Medical noncompliance.  4. Obstructive sleep apnea.  5. Diabetes.  6. Hypertension.  7. Obesity.  8. Fever of unknown origin.  9. Recent gout.   Cause of his syncope is not immediately clear.  This could be  hypoglycemia versus hypotension versus arrhythmia versus hypoxia from  sleep apnea.  We will check a hemoglobin A1c, ammonia level, TSH,  sedimentation rate,  serum uric acid level and ABG.  Given his CT findings, we will also  check a 2-D echocardiogram to rule out aortic stenosis and continue him  on CPAP.  Will hold most of his medications except for his aspirin and  continue to follow this.  Will also watch his calorie counts as well.  The rest of his medical issues will be determined by above.      Hollice Espy, M.D.  Electronically Signed     SKK/MEDQ  D:  04/02/2007  T:  04/02/2007  Job:  [id number]   cc:   Holley Bouche, M.D.  Fax: [phone]

## 2010-05-31 NOTE — Assessment & Plan Note (Signed)
[facility]                               PULMONARY OFFICE NOTE   NAME:[name], [name]                      MRN:          [id number]  DATE:11/07/2005                            DOB:          December 25, 1935    PULMONARY/EXTENDED SUMMARY FINAL FOLLOWUP OFFICE VISIT   HISTORY:  This is a very nice 75 year old white male never smoker seen for  unexplained dyspnea with exertion on September 18, 2005.  I had previously  seen the patient for unexplained pleural effusions with a CT scan showing  evidence of cirrhosis with portal hypertension suggesting the problem may  have been related to thoracic ascites.  In any case, he comes back now  stating that he really is not limited at this point unless he is on a  treadmill and sets the setting too high.  He denies any exertional chest  pain, orthopnea, PND, or leg swelling, or cough.   MEDICATIONS:  Reviewed with him in detail and recorded in the face sheet  dated November 07, 2005.   PHYSICAL EXAMINATION:  He is a pleasant ambulatory obese white male in no  acute distress.  His weight is 262 pounds, which is up another pound from previous visit,  versus a target weight of less than 196.  VITAL SIGNS:  Stable vital signs.  HEENT:  Unremarkable.  Oropharynx is clear.  LUNGS:  Fields are clear bilaterally to auscultation and percussion.  CARDIAC:  Regular rate and rhythm without murmur, gallop, or rub.  ABDOMEN:  Soft and benign.  EXTREMITIES:  Warm without calf tenderness, cyanosis, clubbing, or edema.   PFTs were reviewed with the patient today, indicate normal vital capacity  and DLCO, but he has an ERV of only 34% and somewhat of a saw-toothed  pattern inspiratory loop.   IMPRESSION:  1. Morbid obesity appears to be this patient's primary pulmonary problem      with a classic restrictive change with disproportionate reduction of      energy recovery ventilator consistent with obesity.  This may be  affecting his upper airway directly (he does have sleep apnea, see      below) or indirectly via reflux mechanism.  For now I simply      recommended weight loss effort and spent a lot of time talking to him      about reconditioning issues, given him a target weight of 196.  2. Obstructive sleep apnea.  He has undergone a continuous positive airway      pressure titration indicating that he should be on 14 cm.  Continuous      positive airway pressure titration dated September 24, 2005 indication      that he needs continuous positive airway pressure titration, and has      been on a higher dose than 14, but not able to tell the difference yet.      On the other hand, he is tolerating well and, therefore, I have      recommended he stay on the 14 subject to whether or not he loses  weight, in which case the continuous positive airway pressure may be      titrated down.  3. Finally, I note that he has a record of pleural effusions, but no      recent chest x-ray.  I have recommended a followup chest x-ray to      complete the workup and we will see the patient back here on a p.r.n.      basis.   The patient showed significant enthusiasm for a weight loss program, which I  reviewed with him, that uses the simple concept of calorie balance.  If he  is not able to progress toward goal, however, with the recommendations I  made, I would not hesitate to refer him to a nutritionist for a more  specific calorie intake counting purposes, and a feedback instructions via  a 2-week food diary.    ______________________________  Charlaine Dalton Sherene Sires, MD, [facility]    MBW/MedQ  DD: 11/07/2005  DT: 11/09/2005  Job #: [id number]   cc:   Holley Bouche, M.D.

## 2010-05-31 NOTE — Procedures (Signed)
NAME:  [name], GIVLER NO.:  [id number]   MEDICAL RECORD NO.:  [id number]          PATIENT TYPE:  OUT   LOCATION:  SLEEP CENTER                 FACILITY:  [facility]   PHYSICIAN:  Barbaraann Share, MD,FCCPDATE OF BIRTH:  [birth year] 18, 1935   DATE OF STUDY:  09/24/2005                              NOCTURNAL POLYSOMNOGRAM    REFERRING PHYSICIAN:  Dr. Sandrea Hughs   INDICATIONS FOR STUDY:  Hypersomnia with sleep apnea.   EPWORTH SCORE:  10   SLEEP ARCHITECTURE:  The patient had a total sleep time of 316 minutes,  however, never achieved REM or slow wave sleep.  Sleep onset latency was  mildly prolonged at 41 minutes.  Sleep efficiency was decreased at 77%.   RESPIRATORY DATA:  The patient underwent a CPAP titration study using his  own mask from home.  A chin strap was added because of oral venting.  The  patient's CPAP pressure was increased very rapidly because the patient  requesting more air and also snoring.  At a final pressure of 14 cm, there  was no obstructive events or snoring.   OXYGEN DATA:  His O2 desaturation was as low as 89% before optimal CPAP was  reached.   CARDIAC DATA:  Occasional PVC noted without clinical significance.   MOVEMENT/PARASOMNIA:  The patient was found to have 265 leg jerks with 8.5  per hour resulting in arousal or awakening.  These nearly resolved after  optimal CPAP was reached.   IMPRESSION/RECOMMENDATION:  1. Good control of previously documented obstructive sleep apnea with 14      cm of CPAP.  The patient used his mask from home and a chin strap was      added to prevent oral venting.  2. Large numbers of leg jerks with what appears to be significant sleep      disruption.  However, as the patient's CPAP pressure was optimized,      these greatly decreased in frequency.  More than likely they are not      clinically significant unless the patient has a history that is      suspicious for the restless leg syndrome or periodic leg  movement      syndrome.         ______________________________  Barbaraann Share, MD,FCCP  Diplomate, American Board of Sleep  Medicine    KMC/MEDQ  D:  10/01/2005 15:43:45  T:  10/02/2005 22:25:45  Job:  [id number]

## 2010-05-31 NOTE — Discharge Summary (Signed)
NAME:  [name], [name] NO.:  [id number]   MEDICAL RECORD NO.:  [id number]                   PATIENT TYPE:  INP   LOCATION:  3727                                 FACILITY:  [facility]   PHYSICIAN:  Lonia Blood, M.D.            DATE OF BIRTH:  July 27, 1935   DATE OF ADMISSION:  08/03/2001  DATE OF DISCHARGE:  08/09/2001                                 DISCHARGE SUMMARY   PRIMARY CARE PHYSICIAN:  Holley Bouche, MD   DISCHARGE DIAGNOSES:  1. Cirrhosis, probable steatohepatitis.  2. Large bilateral pleural effusions, presumed secondary to #1.  3. Diabetes mellitus.  4. Hypertension.  5. Depression.  6. Obstructive sleep apnea.  7. Nonobstructive coronary artery disease, via catheterization in 2000 with     the greatest lesion being 30%.   DISCHARGE MEDICATIONS:  1. Lasix 20 mg q.d.  2. Aldactone 20 mg q.d.  3. Aspirin q.d.  4. Zestril 20 mg q.d.  5. Avandia 4 mg q.d.  6. Glucovance 2.5/500 b.i.d.  7. Effexor 75 mg q.d.  8. Lopid 500 mg b.i.d.  9. Niaspan 500  mg q.d.  10.      Multivitamin q.d.   FOLLOW UP:  The patient will followup with Dr. Holley Bouche for ongoing  primary care needs on Tuesday, August 17, 2001, at 10 o'clock in the morning.  Furthermore, the patient is set up  for followup with Dr. Nadine Counts Buccini for  evaluation of probable steatohepatitis and cirrhosis.   PROCEDURES:  1. Echocardiogram on August 04, 2001:  Ejection fraction 55% to 65% and wholly     unremarkable.  2. A CT scan of the  abdomen and pelvis on August 07, 2001:  Sigmoid     diverticular disease. Cirrhosis with portal hypertension. Suspicion for     enlarged pancreatic head. Pleural effusions. Left lower lobe atelectasis.     Coronary artery disease.  3. A CT scan of the  chest on August 06, 2001:  Probable hepatic cirrhosis but     otherwise unremarkable.  4. Ultrasound guided thoracentesis on August 06, 2001.   CONSULTS:  Interventional radiology for  ultrasound  guided thoracentesis.   HISTORY OF PRESENT ILLNESS:  The patient is a 75 year old male who was sent  to the emergency room for progressive dyspnea on exertion and orthopnea.  This had been associated with a low grade temperature requiring Tylenol that  had been going on for approximately three weeks. The patient denied chest  pain, palpitations or diaphoresis. He did report a 15 pound weight loss over  the month prior to  his admission.   PROBLEM #1, NEWLY DIAGNOSED CIRRHOSIS:  A CT scan of the  chest shortly  after admission revealed a probable cirrhosis and large bilateral pleural  effusions. Ultimately the cirrhosis itself was felt to be the etiology of  the patient's large bilateral pleural effusions. A discussion was held with  Dr. Nadine Counts Buccini over the telephone, and a preliminary presumptive diagnosis  of steatohepatitis was made based upon the patient's history. Of course  hepatitis panels were obtained and were, in fact, unremarkable, concerning a  possible  diagnosis of viral hepatitis and cirrhosis. The patient did not have a  significant history of alcohol use to blame this on alcohol abuse.   At the time of discharge the patient is being maintained on Lasix and  Aldactone. There was no significant abdominal ascites appreciated. Further  workup will be accomplished by Dr. Nadine Counts Buccini in the outpatient setting.   PROBLEM #2, LARGE BILATERAL PLEURAL EFFUSIONS:  At the time of admission the  patient was noted to have large bilateral pleural effusions. This was felt  to be the etiology of the patient's symptoms at the time of presentation.  Diagnostic and therapeutic thoracentesis was accomplished on the date noted  above and resulted in significant improvement in the patient's clinical  symptoms.  Cytology was obtained and was in fact unremarkable. Studies were  consistent with an exudative fluid. No other significant findings were  appreciated. Again it was felt that this  likely represented an ascites  equivalent and was related to the patient's cirrhosis.   At the time of discharge the patient was able to ambulate without use of  supplemental oxygen. He will continue to be diuresed in the outpatient  setting using Lasix and Aldactone.   PROBLEM #3, DIABETES:  Initially  the patient's home medications were held  because of concern of possible new diagnosis of CHF. At the time of  discharge the patient was  advised to resume his home medications as he  would be resuming his usual outpatient diet. Otherwise his CBG was well  controlled during this hospitalization.   PROBLEM #4, HYPERTENSION: During this hospitalization his blood pressure was  well controlled. Lasix and Aldactone were added, as indicated for cirrhosis.  The patient's blood pressure was well controlled and no acute difficulty was  encountered  concerning this issue during the hospitalization. At discharge  the patient is on the medical regimen as listed above.                                               Lonia Blood, M.D.    JTM/MEDQ  D:  09/22/2001  T:  09/24/2001  Job:  [id number]

## 2010-10-07 LAB — DIFFERENTIAL
Basophils Relative: 1
Lymphs Abs: 0.7
Monocytes Absolute: 0.9
Monocytes Relative: 13 — ABNORMAL HIGH
Neutro Abs: 5.2

## 2010-10-07 LAB — COMPREHENSIVE METABOLIC PANEL
ALT: 25
ALT: 28
Albumin: 2.4 — ABNORMAL LOW
Alkaline Phosphatase: 64
Alkaline Phosphatase: 65
CO2: 28
Calcium: 8.9
Glucose, Bld: 87
Potassium: 3.8
Potassium: 4.8
Sodium: 133 — ABNORMAL LOW
Sodium: 137
Total Protein: 6.1
Total Protein: 6.7

## 2010-10-07 LAB — PROTIME-INR: INR: 1.2

## 2010-10-07 LAB — CBC
Hemoglobin: 11.4 — ABNORMAL LOW
MCHC: 34.6
Platelets: 110 — ABNORMAL LOW
RBC: 3.57 — ABNORMAL LOW
RDW: 13.6

## 2010-10-07 LAB — BLOOD GAS, ARTERIAL
Acid-Base Excess: 1.8
Drawn by: 103701
pCO2 arterial: 39
pH, Arterial: 7.432
pO2, Arterial: 71.9 — ABNORMAL LOW

## 2010-10-07 LAB — URIC ACID: Uric Acid, Serum: 6.3

## 2010-10-07 LAB — TSH: TSH: 1.721

## 2010-11-26 ENCOUNTER — Other Ambulatory Visit: Payer: Self-pay | Admitting: Gastroenterology

## 2010-11-26 DIAGNOSIS — K7460 Unspecified cirrhosis of liver: Secondary | ICD-10-CM

## 2010-12-10 ENCOUNTER — Ambulatory Visit
Admission: RE | Admit: 2010-12-10 | Discharge: 2010-12-10 | Disposition: A | Discharge status: Home / Self Care | Payer: BC Managed Care – PPO | Source: Ambulatory Visit | Attending: Gastroenterology | Admitting: Gastroenterology

## 2010-12-10 DIAGNOSIS — K7460 Unspecified cirrhosis of liver: Secondary | ICD-10-CM

## 2010-12-10 MED ORDER — GADOBENATE DIMEGLUMINE 529 MG/ML IV SOLN
20.0000 mL | Freq: Once | INTRAVENOUS | Status: AC | PRN
  Administered 2010-12-10: 20 mL via INTRAVENOUS

## 2012-02-12 ENCOUNTER — Other Ambulatory Visit: Payer: Self-pay | Admitting: Gastroenterology

## 2012-02-12 DIAGNOSIS — K7460 Unspecified cirrhosis of liver: Secondary | ICD-10-CM

## 2012-02-17 ENCOUNTER — Ambulatory Visit
Admission: RE | Admit: 2012-02-17 | Discharge: 2012-02-17 | Disposition: A | Discharge status: Home / Self Care | Payer: BC Managed Care – PPO | Source: Ambulatory Visit | Attending: Gastroenterology | Admitting: Gastroenterology

## 2012-02-17 DIAGNOSIS — K7460 Unspecified cirrhosis of liver: Secondary | ICD-10-CM

## 2012-04-27 ENCOUNTER — Ambulatory Visit: Payer: BC Managed Care – PPO | Attending: Internal Medicine | Admitting: Physical Therapy

## 2012-04-27 DIAGNOSIS — Z9181 History of falling: Secondary | ICD-10-CM | POA: Insufficient documentation

## 2012-04-27 DIAGNOSIS — IMO0001 Reserved for inherently not codable concepts without codable children: Secondary | ICD-10-CM | POA: Insufficient documentation

## 2012-04-27 DIAGNOSIS — R5381 Other malaise: Secondary | ICD-10-CM | POA: Insufficient documentation

## 2012-05-03 ENCOUNTER — Encounter: Payer: Self-pay | Admitting: Physical Therapy

## 2012-05-04 ENCOUNTER — Ambulatory Visit: Payer: BC Managed Care – PPO | Admitting: Physical Therapy

## 2012-05-06 ENCOUNTER — Ambulatory Visit: Payer: BC Managed Care – PPO | Admitting: Physical Therapy

## 2012-05-11 ENCOUNTER — Ambulatory Visit: Payer: BC Managed Care – PPO | Admitting: Physical Therapy

## 2012-05-13 ENCOUNTER — Ambulatory Visit: Payer: BC Managed Care – PPO | Attending: Internal Medicine | Admitting: Physical Therapy

## 2012-05-13 DIAGNOSIS — IMO0001 Reserved for inherently not codable concepts without codable children: Secondary | ICD-10-CM | POA: Insufficient documentation

## 2012-05-13 DIAGNOSIS — Z9181 History of falling: Secondary | ICD-10-CM | POA: Insufficient documentation

## 2012-05-13 DIAGNOSIS — R5381 Other malaise: Secondary | ICD-10-CM | POA: Insufficient documentation

## 2012-05-18 ENCOUNTER — Ambulatory Visit: Payer: BC Managed Care – PPO | Admitting: Physical Therapy

## 2012-05-20 ENCOUNTER — Encounter: Payer: BC Managed Care – PPO | Admitting: Physical Therapy

## 2012-05-25 ENCOUNTER — Encounter: Payer: BC Managed Care – PPO | Admitting: Physical Therapy

## 2012-05-27 ENCOUNTER — Encounter: Payer: BC Managed Care – PPO | Admitting: Physical Therapy

## 2012-06-28 ENCOUNTER — Other Ambulatory Visit: Payer: Self-pay | Admitting: Family Medicine

## 2012-06-28 DIAGNOSIS — R042 Hemoptysis: Secondary | ICD-10-CM

## 2012-06-30 ENCOUNTER — Ambulatory Visit
Admission: RE | Admit: 2012-06-30 | Discharge: 2012-06-30 | Disposition: A | Discharge status: Home / Self Care | Payer: BC Managed Care – PPO | Source: Ambulatory Visit | Attending: Family Medicine | Admitting: Family Medicine

## 2012-06-30 DIAGNOSIS — R042 Hemoptysis: Secondary | ICD-10-CM

## 2012-06-30 MED ORDER — IOHEXOL 300 MG/ML  SOLN
75.0000 mL | Freq: Once | INTRAMUSCULAR | Status: AC | PRN
  Administered 2012-06-30: 75 mL via INTRAVENOUS

## 2012-09-21 ENCOUNTER — Inpatient Hospital Stay (HOSPITAL_COMMUNITY): Payer: BC Managed Care – PPO

## 2012-09-21 ENCOUNTER — Encounter (HOSPITAL_COMMUNITY): Payer: Self-pay | Admitting: Emergency Medicine

## 2012-09-21 ENCOUNTER — Inpatient Hospital Stay (HOSPITAL_COMMUNITY)
Admission: EM | Admit: 2012-09-21 | Discharge: 2012-09-23 | DRG: 205 | Disposition: A | Discharge status: Home / Self Care | Payer: BC Managed Care – PPO | Attending: Internal Medicine | Admitting: Internal Medicine

## 2012-09-21 DIAGNOSIS — I359 Nonrheumatic aortic valve disorder, unspecified: Secondary | ICD-10-CM

## 2012-09-21 DIAGNOSIS — G4733 Obstructive sleep apnea (adult) (pediatric): Secondary | ICD-10-CM

## 2012-09-21 DIAGNOSIS — I129 Hypertensive chronic kidney disease with stage 1 through stage 4 chronic kidney disease, or unspecified chronic kidney disease: Secondary | ICD-10-CM | POA: Diagnosis present

## 2012-09-21 DIAGNOSIS — N189 Chronic kidney disease, unspecified: Secondary | ICD-10-CM | POA: Diagnosis present

## 2012-09-21 DIAGNOSIS — I10 Essential (primary) hypertension: Secondary | ICD-10-CM

## 2012-09-21 DIAGNOSIS — K7689 Other specified diseases of liver: Secondary | ICD-10-CM

## 2012-09-21 DIAGNOSIS — K7290 Hepatic failure, unspecified without coma: Principal | ICD-10-CM | POA: Diagnosis present

## 2012-09-21 DIAGNOSIS — IMO0002 Reserved for concepts with insufficient information to code with codable children: Secondary | ICD-10-CM | POA: Diagnosis present

## 2012-09-21 DIAGNOSIS — E40 Kwashiorkor: Secondary | ICD-10-CM

## 2012-09-21 DIAGNOSIS — E1165 Type 2 diabetes mellitus with hyperglycemia: Secondary | ICD-10-CM | POA: Diagnosis present

## 2012-09-21 DIAGNOSIS — E119 Type 2 diabetes mellitus without complications: Secondary | ICD-10-CM

## 2012-09-21 DIAGNOSIS — Z7982 Long term (current) use of aspirin: Secondary | ICD-10-CM

## 2012-09-21 DIAGNOSIS — D696 Thrombocytopenia, unspecified: Secondary | ICD-10-CM

## 2012-09-21 DIAGNOSIS — Z794 Long term (current) use of insulin: Secondary | ICD-10-CM

## 2012-09-21 DIAGNOSIS — E785 Hyperlipidemia, unspecified: Secondary | ICD-10-CM

## 2012-09-21 DIAGNOSIS — K7682 Hepatic encephalopathy: Principal | ICD-10-CM

## 2012-09-21 DIAGNOSIS — E669 Obesity, unspecified: Secondary | ICD-10-CM

## 2012-09-21 HISTORY — DX: Type 2 diabetes mellitus without complications: E11.9

## 2012-09-21 HISTORY — DX: Nonalcoholic steatohepatitis (NASH): K75.81

## 2012-09-21 HISTORY — DX: Disorder of kidney and ureter, unspecified: N28.9

## 2012-09-21 LAB — COMPREHENSIVE METABOLIC PANEL
ALT: 46 U/L (ref 0–53)
AST: 85 U/L — ABNORMAL HIGH (ref 0–37)
Albumin: 2.8 g/dL — ABNORMAL LOW (ref 3.5–5.2)
CO2: 25 mEq/L (ref 19–32)
Calcium: 9.5 mg/dL (ref 8.4–10.5)
Sodium: 136 mEq/L (ref 135–145)
Total Protein: 7.7 g/dL (ref 6.0–8.3)

## 2012-09-21 LAB — POCT I-STAT 3, ART BLOOD GAS (G3+)
Acid-Base Excess: 2 mmol/L (ref 0.0–2.0)
Bicarbonate: 24.2 mEq/L — ABNORMAL HIGH (ref 20.0–24.0)
O2 Saturation: 99 %
Patient temperature: 98.6
TCO2: 25 mmol/L (ref 0–100)
pCO2 arterial: 29.7 mmHg — ABNORMAL LOW (ref 35.0–45.0)
pH, Arterial: 7.518 — ABNORMAL HIGH (ref 7.350–7.450)
pO2, Arterial: 121 mmHg — ABNORMAL HIGH (ref 80.0–100.0)

## 2012-09-21 LAB — CBC WITH DIFFERENTIAL/PLATELET
Basophils Absolute: 0 10*3/uL (ref 0.0–0.1)
Basophils Relative: 1 % (ref 0–1)
Eosinophils Absolute: 0.1 10*3/uL (ref 0.0–0.7)
Eosinophils Relative: 3 % (ref 0–5)
HCT: 38.1 % — ABNORMAL LOW (ref 39.0–52.0)
Hemoglobin: 13.3 g/dL (ref 13.0–17.0)
Lymphocytes Relative: 31 % (ref 12–46)
Lymphs Abs: 1.4 K/uL (ref 0.7–4.0)
MCH: 32.3 pg (ref 26.0–34.0)
MCHC: 34.9 g/dL (ref 30.0–36.0)
MCV: 92.5 fL (ref 78.0–100.0)
Monocytes Absolute: 0.7 K/uL (ref 0.1–1.0)
Monocytes Relative: 15 % — ABNORMAL HIGH (ref 3–12)
Neutro Abs: 2.3 K/uL (ref 1.7–7.7)
Neutrophils Relative %: 50 % (ref 43–77)
Platelets: 65 10*3/uL — ABNORMAL LOW (ref 150–400)
RBC: 4.12 MIL/uL — ABNORMAL LOW (ref 4.22–5.81)
RDW: 14.2 % (ref 11.5–15.5)
WBC: 4.6 10*3/uL (ref 4.0–10.5)

## 2012-09-21 LAB — URINALYSIS, ROUTINE W REFLEX MICROSCOPIC
Bilirubin Urine: NEGATIVE
Glucose, UA: NEGATIVE mg/dL
Hgb urine dipstick: NEGATIVE
Ketones, ur: NEGATIVE mg/dL
Leukocytes, UA: NEGATIVE
Nitrite: NEGATIVE
Protein, ur: NEGATIVE mg/dL
Specific Gravity, Urine: 1.014 (ref 1.005–1.030)
Urobilinogen, UA: 1 mg/dL (ref 0.0–1.0)
pH: 7 (ref 5.0–8.0)

## 2012-09-21 LAB — GLUCOSE, CAPILLARY
Glucose-Capillary: 145 mg/dL — ABNORMAL HIGH (ref 70–99)
Glucose-Capillary: 99 mg/dL (ref 70–99)

## 2012-09-21 LAB — COMPREHENSIVE METABOLIC PANEL WITH GFR
Alkaline Phosphatase: 95 U/L (ref 39–117)
BUN: 27 mg/dL — ABNORMAL HIGH (ref 6–23)
Chloride: 100 meq/L (ref 96–112)
Creatinine, Ser: 1.37 mg/dL — ABNORMAL HIGH (ref 0.50–1.35)
GFR calc Af Amer: 57 mL/min — ABNORMAL LOW (ref >90)
GFR calc non Af Amer: 49 mL/min — ABNORMAL LOW (ref >90)
Glucose, Bld: 151 mg/dL — ABNORMAL HIGH (ref 70–99)
Potassium: 4.5 meq/L (ref 3.5–5.1)
Total Bilirubin: 1.2 mg/dL (ref 0.3–1.2)

## 2012-09-21 LAB — AMMONIA: Ammonia: 135 umol/L — ABNORMAL HIGH (ref 11–60)

## 2012-09-21 LAB — LIPASE, BLOOD: Lipase: 57 U/L (ref 11–59)

## 2012-09-21 LAB — PROTIME-INR
INR: 1.16 (ref 0.00–1.49)
Prothrombin Time: 14.6 s (ref 11.6–15.2)

## 2012-09-21 LAB — APTT: aPTT: 36 s (ref 24–37)

## 2012-09-21 MED ORDER — LACTULOSE 10 GM/15ML PO SOLN
20.0000 g | Freq: Once | ORAL | Status: AC
  Administered 2012-09-21: 20 g via ORAL
  Filled 2012-09-21: qty 30

## 2012-09-21 MED ORDER — ONDANSETRON HCL 4 MG PO TABS: 4.0000 mg | ORAL_TABLET | Freq: Four times a day (QID) | ORAL | Status: DC | PRN

## 2012-09-21 MED ORDER — ONDANSETRON HCL 4 MG/2ML IJ SOLN: 4.0000 mg | Freq: Four times a day (QID) | INTRAMUSCULAR | Status: DC | PRN

## 2012-09-21 MED ORDER — PROPRANOLOL HCL 10 MG PO TABS
10.0000 mg | ORAL_TABLET | Freq: Two times a day (BID) | ORAL | Status: DC
  Administered 2012-09-22 – 2012-09-23 (×4): 10 mg via ORAL
  Filled 2012-09-21 (×5): qty 1

## 2012-09-21 MED ORDER — FERROUS SULFATE 325 (65 FE) MG PO TABS
325.0000 mg | ORAL_TABLET | Freq: Every day | ORAL | Status: DC
  Administered 2012-09-22 (×2): 325 mg via ORAL
  Filled 2012-09-21 (×3): qty 1

## 2012-09-21 MED ORDER — HYDRALAZINE HCL 20 MG/ML IJ SOLN: 10.0000 mg | INTRAMUSCULAR | Status: DC | PRN

## 2012-09-21 MED ORDER — VENLAFAXINE HCL 75 MG PO TABS
75.0000 mg | ORAL_TABLET | Freq: Two times a day (BID) | ORAL | Status: DC
  Administered 2012-09-22 – 2012-09-23 (×4): 75 mg via ORAL
  Filled 2012-09-21 (×5): qty 1

## 2012-09-21 MED ORDER — INSULIN ASPART 100 UNIT/ML ~~LOC~~ SOLN
0.0000 [IU] | Freq: Three times a day (TID) | SUBCUTANEOUS | Status: DC
  Administered 2012-09-22: 1 [IU] via SUBCUTANEOUS
  Administered 2012-09-22: 2 [IU] via SUBCUTANEOUS
  Administered 2012-09-23 (×2): 3 [IU] via SUBCUTANEOUS

## 2012-09-21 MED ORDER — SODIUM CHLORIDE 0.9 % IV BOLUS (SEPSIS)
500.0000 mL | Freq: Once | INTRAVENOUS | Status: AC
  Administered 2012-09-21: 500 mL via INTRAVENOUS

## 2012-09-21 MED ORDER — INSULIN DETEMIR 100 UNIT/ML ~~LOC~~ SOLN
80.0000 [IU] | Freq: Two times a day (BID) | SUBCUTANEOUS | Status: DC
  Administered 2012-09-22 – 2012-09-23 (×4): 80 [IU] via SUBCUTANEOUS
  Filled 2012-09-21 (×5): qty 0.8

## 2012-09-21 MED ORDER — SODIUM CHLORIDE 0.9 % IJ SOLN
3.0000 mL | Freq: Two times a day (BID) | INTRAMUSCULAR | Status: DC
  Administered 2012-09-22 – 2012-09-23 (×4): 3 mL via INTRAVENOUS

## 2012-09-21 MED ORDER — PANTOPRAZOLE SODIUM 40 MG PO TBEC
40.0000 mg | DELAYED_RELEASE_TABLET | Freq: Every day | ORAL | Status: DC
  Administered 2012-09-22 – 2012-09-23 (×2): 40 mg via ORAL
  Filled 2012-09-21 (×2): qty 1

## 2012-09-21 MED ORDER — RIFAXIMIN 550 MG PO TABS
550.0000 mg | ORAL_TABLET | Freq: Two times a day (BID) | ORAL | Status: DC
  Administered 2012-09-22 – 2012-09-23 (×4): 550 mg via ORAL
  Filled 2012-09-21 (×5): qty 1

## 2012-09-21 MED ORDER — ACETAMINOPHEN 325 MG PO TABS: 650.0000 mg | ORAL_TABLET | Freq: Four times a day (QID) | ORAL | Status: DC | PRN

## 2012-09-21 MED ORDER — GEMFIBROZIL 600 MG PO TABS
600.0000 mg | ORAL_TABLET | Freq: Two times a day (BID) | ORAL | Status: DC
  Administered 2012-09-22 (×2): 600 mg via ORAL
  Filled 2012-09-21 (×3): qty 1

## 2012-09-21 MED ORDER — INSULIN DETEMIR 100 UNIT/ML FLEXPEN: 80.0000 [IU] | PEN_INJECTOR | Freq: Two times a day (BID) | SUBCUTANEOUS | Status: DC

## 2012-09-21 MED ORDER — ACETAMINOPHEN 650 MG RE SUPP: 650.0000 mg | Freq: Four times a day (QID) | RECTAL | Status: DC | PRN

## 2012-09-21 MED ORDER — ATORVASTATIN CALCIUM 40 MG PO TABS: 40.0000 mg | ORAL_TABLET | Freq: Every day | ORAL | Status: DC

## 2012-09-21 MED ORDER — DARIFENACIN HYDROBROMIDE ER 7.5 MG PO TB24
7.5000 mg | ORAL_TABLET | Freq: Every day | ORAL | Status: DC
  Administered 2012-09-22 – 2012-09-23 (×2): 7.5 mg via ORAL
  Filled 2012-09-21 (×2): qty 1

## 2012-09-21 MED ORDER — DEXTROSE 5 % IV SOLN
1.0000 g | INTRAVENOUS | Status: DC
  Administered 2012-09-22: 1 g via INTRAVENOUS
  Filled 2012-09-21 (×2): qty 10

## 2012-09-21 MED ORDER — LACTULOSE 10 GM/15ML PO SOLN
30.0000 g | Freq: Three times a day (TID) | ORAL | Status: DC
  Administered 2012-09-22 – 2012-09-23 (×6): 30 g via ORAL
  Filled 2012-09-21 (×7): qty 45

## 2012-09-21 NOTE — H&P (Signed)
[facility] History and Physical  CLAIR ALFIERI VOZ:[id number] DOB: September 26, 1935 DOA: 09/21/2012  Referring physician: ER physician. PCP: Johny Blamer, MD   Chief Complaint: Increasing tremors and confusion and sleepiness.  HPI: ODIS TURCK is a 77 y.o. male history of cirrhosis of liver secondary to nonalcoholic steatohepatitis was brought to the ER after patient's wife noticed that patient has been getting increasingly confused with sleepiness and tremors. Patient was noticed to have these symptoms gradually worsening over the last 2 weeks and yesterday it got acutely worse and patient who is usually a Social research officer, government in the [employer] he was unable to operate his computer. In the ER patient was found to have elevated ammonia levels and patient has been admitted for further management. Patient presently is alert awake oriented to time place and person. Has mild tremors in both upper extremities. Denies any abdominal pain. But patient's wife has noticed increasing girth of his abdomen and lower extremity edema. Patient states he's been taking Lasix every day as advised. Patient has been admitted for further management.  Review of Systems: As presented in the history of presenting illness, rest negative.  Past Medical History  Diagnosis Date  . NASH (nonalcoholic steatohepatitis)   . Diabetes mellitus without complication   . Kidney disease    History reviewed. No pertinent past surgical history. Social History:  reports that he has never smoked. He does not have any smokeless tobacco history on file. He reports that  drinks alcohol. He reports that he does not use illicit drugs.  home. where does patient live-- Can do ADLs. Can patient participate in ADLs?  Allergies  Allergen Reactions  . Aldactone [Spironolactone] Swelling    Breast swell    History reviewed. No pertinent family history.    Prior to Admission medications   Medication Sig Start Date End Date Taking?  Authorizing Provider  aMILoride (MIDAMOR) 5 MG tablet Take 5 mg by mouth daily.   Yes Historical Provider, MD  aspirin EC 81 MG tablet Take 81 mg by mouth 2 (two) times daily.   Yes Historical Provider, MD  cholecalciferol (VITAMIN D) 1000 UNITS tablet Take 1,000 Units by mouth every morning.   Yes Historical Provider, MD  clotrimazole-betamethasone (LOTRISONE) cream Apply 1 application topically 2 (two) times daily.   Yes Historical Provider, MD  ferrous sulfate 325 (65 FE) MG tablet Take 325 mg by mouth at bedtime.   Yes Historical Provider, MD  gemfibrozil (LOPID) 600 MG tablet Take 600 mg by mouth 2 (two) times daily before a meal.   Yes Historical Provider, MD  insulin aspart (NOVOLOG FLEXPEN) 100 UNIT/ML SOPN FlexPen Inject 32-50 Units into the skin 3 (three) times daily with meals. 32 units if blood sugar is 70-200. Take 50 units if blood sugar is above 200.   Yes Historical Provider, MD  Insulin Detemir (LEVEMIR FLEXPEN) 100 UNIT/ML SOPN Inject 80 Units into the skin 2 (two) times daily. Sliding scale as directed   Yes Historical Provider, MD  losartan (COZAAR) 100 MG tablet Take 50 mg by mouth daily.   Yes Historical Provider, MD  metFORMIN (GLUCOPHAGE) 500 MG tablet Take 500 mg by mouth daily with breakfast.   Yes Historical Provider, MD  Multiple Vitamin (MULTIVITAMIN WITH MINERALS) TABS tablet Take 1 tablet by mouth daily.   Yes Historical Provider, MD  omeprazole (PRILOSEC) 20 MG capsule Take 20 mg by mouth daily.   Yes Historical Provider, MD  PRESCRIPTION MEDICATION Apply 1 application topically daily as needed.  Eucerin/Triamcinolone 0.1% apply for dry skin   Yes Historical Provider, MD  Probiotic Product (PROBIOTIC DAILY) CAPS Take 2 capsules by mouth every morning.   Yes Historical Provider, MD  propranolol (INDERAL) 10 MG tablet Take 10 mg by mouth 2 (two) times daily.   Yes Historical Provider, MD  rifaximin (XIFAXAN) 550 MG TABS tablet Take 550 mg by mouth 2 (two) times daily.    Yes Historical Provider, MD  rosuvastatin (CRESTOR) 20 MG tablet Take 20 mg by mouth every morning.   Yes Historical Provider, MD  solifenacin (VESICARE) 5 MG tablet Take 5 mg by mouth daily.   Yes Historical Provider, MD  venlafaxine (EFFEXOR) 75 MG tablet Take 75 mg by mouth 2 (two) times daily.   Yes Historical Provider, MD   Physical Exam: Filed Vitals:   09/21/12 2115 09/21/12 2130 09/21/12 2145 09/21/12 2200  BP: 129/52 125/71 138/59 126/59  Pulse: 66 64 70 63  Temp:      TempSrc:      Resp: 20 20 21 15   Height:      Weight:      SpO2: 95% 95% 98% 96%     General:  Well-developed and nourished.  Eyes: Anicteric no pallor.  ENT: No discharge from ears eyes nose mouth.  Neck: No mass felt.  Cardiovascular: S1-S2 heard.  Respiratory: No rhonchi or crepitations.  Abdomen: Distended abdomen. No guarding or rigidity.  Skin: No rash.  Musculoskeletal: Bilateral lower extremity edema.  Psychiatric: Appears normal at this time.  Neurologic: Alert awake oriented to time place and person. Moves all extremities.  Labs on Admission:  Basic Metabolic Panel:  Recent Labs Lab 09/21/12 1705  NA 136  K 4.5  CL 100  CO2 25  GLUCOSE 151*  BUN 27*  CREATININE 1.37*  CALCIUM 9.5   Liver Function Tests:  Recent Labs Lab 09/21/12 1705  AST 85*  ALT 46  ALKPHOS 95  BILITOT 1.2  PROT 7.7  ALBUMIN 2.8*    Recent Labs Lab 09/21/12 1945  LIPASE 57    Recent Labs Lab 09/21/12 1705  AMMONIA 135*   CBC:  Recent Labs Lab 09/21/12 1705  WBC 4.6  NEUTROABS 2.3  HGB 13.3  HCT 38.1*  MCV 92.5  PLT 65*   Cardiac Enzymes: No results found for this basename: CKTOTAL, CKMB, CKMBINDEX, TROPONINI,  in the last 168 hours  BNP (last 3 results) No results found for this basename: PROBNP,  in the last 8760 hours CBG:  Recent Labs Lab 09/21/12 1712 09/21/12 2124  GLUCAP 145* 99    Radiological Exams on Admission: Ct Head Wo Contrast  09/21/2012    *RADIOLOGY REPORT*  Clinical Data: Confusion.  Tremors and shakes today with increasing confusion.  Increased sleeping.  CT HEAD WITHOUT CONTRAST  Technique:  Contiguous axial images were obtained from the base of the skull through the vertex without contrast.  Comparison: 04/02/2007  Findings: Mild cerebral atrophy.  Calcifications in the basal ganglia bilaterally and in the left pons are stable.  No ventricular dilatation.  No mass effect or midline shift.  No abnormal extra-axial fluid collections.  Gray-white matter junctions are distinct.  Basal cisterns are not effaced.  No evidence of acute intracranial hemorrhage.  No depressed skull fractures.  Visualized paranasal sinuses and mastoid air cells are clear.  Vascular calcifications.  IMPRESSION: No acute intracranial abnormalities.   Original Report Authenticated By: Burman Nieves, M.D.   Dg Chest Port 1 View  09/21/2012   *RADIOLOGY  REPORT*  Clinical Data: Check for pleural effusion.  Extreme sleepiness. Confusion.  Tremors.  History of diabetes, kidney disease.  PORTABLE CHEST - 1 VIEW  Comparison: Chest x-ray 08/19/2011, chest CT 07/01/2012  Findings: The heart is mildly enlarged.  There are no focal consolidations or pleural effusions.  No pulmonary edema.  Shallow lung inflation.  IMPRESSION: Mild cardiomegaly.   Original Report Authenticated By: Norva Pavlov, M.D.     Assessment/Plan Principal Problem:   Hepatic encephalopathy Active Problems:   OBSTRUCTIVE SLEEP APNEA   Diabetes mellitus   HTN (hypertension)   1. Hepatic encephalopathy - patient's lethargy and confusion most likely is related to hepatic encephalopathy. Patient states that he has not taken lactulose for many years because of diarrhea. At this time we will continue with rifaximin and patient has been started on lactulose. Closely follow ammonia levels. In addition we will also check CT head and ABG to make sure there is no carbon dioxide retention since patient has  history of OSA. And also check TSH. 2. Ascites and lower extremity edema - patient's abdomen has increased girth as per patient's wife. Patient wife also has noticed increasing lower extremity edema. Patient states that he takes Lasix though it is not mentioned in his medication list. At this time I have placed patient empirically on ceftriaxone for possible SBP. I have ordered ultrasound guided paracentesis for diagnostic purpose. Check Dopplers of the lower extremity. Further recommendations per GI. 3. Chronic kidney disease - patient follows with Dr. Kathe Mariner of nephrology. I don't know patient's baseline creatinine. At this time I'm holding off patient's Cozaar and diuretics for now. Closely follow intake output and metabolic panel. 4. Hypertension - hold Cozaar because of mildly elevated creatinine given patient's history of liver disease. Patient has been placed on when necessary IV hydralazine for systolic blood pressure more than 160. 5. Obstructive sleep apnea - continue CPAP. 6. Diabetes mellitus type 2 - hold metformin for now. Continue other home medications and closely follow CBGs.    Code Status:  Full code.  Family Communication:  Patient's wife at the bedside.  Disposition Plan:  Admit to inpatient.    Kynlie Jane N. [facility] Pager [phone].  If 7PM-7AM, please contact night-coverage [email or web address] Password [email or web address] 09/21/2012, 10:59 PM

## 2012-09-21 NOTE — ED Notes (Signed)
Pt requested and was given [place] sandwich.

## 2012-09-21 NOTE — ED Notes (Signed)
Report given to rn. Pt transported to floor via stretcher. nadn.

## 2012-09-21 NOTE — ED Notes (Signed)
Pt c/o tremors and shakes today with increased confusion per wife; pt with hx of DM, liver issues and kidney issues; pt unable to log into computer today which is not norm and increased amount of sleeping

## 2012-09-21 NOTE — ED Provider Notes (Signed)
CSN: [id number]     Arrival date & time 09/21/12  1641 History   First MD Initiated Contact with Patient 09/21/12 1825     Chief Complaint  Patient presents with  . Weakness  . Tremors   (Consider location/radiation/quality/duration/timing/severity/associated sxs/prior Treatment) HPI Comments: Pt teaches engineering, usually very bright, no memory problems, has had trouble typing and logging into programs on computer, since yesteray has noticed more severe tremors of arms, legs at times, seems more frequent.  Was told that his ammonia level was high on August 28th as outpt, was on lactulost, but has not been taking recently.  Pt has a h/o cirrhosis, NASH.  No fevers, HA, stiff neck.  No cough, CP, SOB.  No abd pain. Has had diarrhea intermittently, not at present.    Patient is a 77 y.o. male presenting with altered mental status. The history is provided by the patient and the spouse. The history is limited by the condition of the patient.  Altered Mental Status Severity:  Moderate Most recent episode:  Today Episode history:  Multiple Timing:  Intermittent Progression:  Worsening Chronicity:  Recurrent Context: not taking medications as prescribed   Context: not alcohol use, not dementia, not drug use, not head injury, not a recent illness and not a recent infection   Associated symptoms: abnormal movement and slurred speech   Associated symptoms: no abdominal pain, no difficulty breathing, no fever, no hallucinations, no headaches, no nausea, no vomiting and no weakness     Past Medical History  Diagnosis Date  . NASH (nonalcoholic steatohepatitis)   . Diabetes mellitus without complication   . Kidney disease    History reviewed. No pertinent past surgical history. History reviewed. No pertinent family history. History  Substance Use Topics  . Smoking status: Never Smoker   . Smokeless tobacco: Not on file  . Alcohol Use: Yes     Comment: occ    Review of Systems  Unable to  perform ROS: Mental status change  Constitutional: Negative for fever.  Gastrointestinal: Negative for nausea, vomiting and abdominal pain.  Neurological: Negative for weakness and headaches.  Psychiatric/Behavioral: Negative for hallucinations.    Allergies  Aldactone  Home Medications   Current Outpatient Rx  Name  Route  Sig  Dispense  Refill  . aMILoride (MIDAMOR) 5 MG tablet   Oral   Take 5 mg by mouth daily.         Marland Kitchen aspirin EC 81 MG tablet   Oral   Take 81 mg by mouth 2 (two) times daily.         . cholecalciferol (VITAMIN D) 1000 UNITS tablet   Oral   Take 1,000 Units by mouth every morning.         . clotrimazole-betamethasone (LOTRISONE) cream   Topical   Apply 1 application topically 2 (two) times daily.         . ferrous sulfate 325 (65 FE) MG tablet   Oral   Take 325 mg by mouth at bedtime.         Marland Kitchen gemfibrozil (LOPID) 600 MG tablet   Oral   Take 600 mg by mouth 2 (two) times daily before a meal.         . insulin aspart (NOVOLOG FLEXPEN) 100 UNIT/ML SOPN FlexPen   Subcutaneous   Inject 32-50 Units into the skin 3 (three) times daily with meals. 32 units if blood sugar is 70-200. Take 50 units if blood sugar is above 200.         Marland Kitchen  Insulin Detemir (LEVEMIR FLEXPEN) 100 UNIT/ML SOPN   Subcutaneous   Inject 80 Units into the skin 2 (two) times daily. Sliding scale as directed         . losartan (COZAAR) 100 MG tablet   Oral   Take 50 mg by mouth daily.         . metFORMIN (GLUCOPHAGE) 500 MG tablet   Oral   Take 500 mg by mouth daily with breakfast.         . Multiple Vitamin (MULTIVITAMIN WITH MINERALS) TABS tablet   Oral   Take 1 tablet by mouth daily.         Marland Kitchen omeprazole (PRILOSEC) 20 MG capsule   Oral   Take 20 mg by mouth daily.         Marland Kitchen PRESCRIPTION MEDICATION   Topical   Apply 1 application topically daily as needed. Eucerin/Triamcinolone 0.1% apply for dry skin         . Probiotic Product (PROBIOTIC  DAILY) CAPS   Oral   Take 2 capsules by mouth every morning.         . propranolol (INDERAL) 10 MG tablet   Oral   Take 10 mg by mouth 2 (two) times daily.         . rifaximin (XIFAXAN) 550 MG TABS tablet   Oral   Take 550 mg by mouth 2 (two) times daily.         . rosuvastatin (CRESTOR) 20 MG tablet   Oral   Take 20 mg by mouth every morning.         . solifenacin (VESICARE) 5 MG tablet   Oral   Take 5 mg by mouth daily.         Marland Kitchen venlafaxine (EFFEXOR) 75 MG tablet   Oral   Take 75 mg by mouth 2 (two) times daily.          BP 130/67  Pulse 63  Temp(Src) 98.6 F (37 C) (Oral)  Resp 18  Ht 6\' 1"  (1.854 m)  Wt 200 lb (90.719 kg)  BMI 26.39 kg/m2  SpO2 98% Physical Exam  Nursing note and vitals reviewed. Constitutional: He appears well-developed and well-nourished. No distress.  HENT:  Head: Normocephalic and atraumatic.  Eyes: Conjunctivae and EOM are normal. No scleral icterus.  Neck: Normal range of motion. Neck supple.  Cardiovascular: Normal rate, regular rhythm and intact distal pulses.   Abdominal: Soft. He exhibits no distension. There is no tenderness. There is no rebound and no guarding.  Neurological: He is alert. He is not disoriented. He displays tremor. No sensory deficit. He exhibits normal muscle tone. Coordination abnormal. GCS eye subscore is 4. GCS verbal subscore is 5. GCS motor subscore is 6.  Skin: Skin is warm. No rash noted. He is not diaphoretic. No pallor.  Psychiatric: His affect is blunt and labile. His speech is slurred. He is slowed. He expresses impulsivity.  Pt is often argumentative, occasional word finding difficulty.  Slurred speech is attributed to dry mouth per spouse    ED Course  Procedures (including critical care time) Labs Review Labs Reviewed  CBC WITH DIFFERENTIAL - Abnormal; Notable for the following:    RBC 4.12 (*)    HCT 38.1 (*)    Platelets 65 (*)    Monocytes Relative 15 (*)    All other components  within normal limits  COMPREHENSIVE METABOLIC PANEL - Abnormal; Notable for the following:    Glucose, Bld 151 (*)  BUN 27 (*)    Creatinine, Ser 1.37 (*)    Albumin 2.8 (*)    AST 85 (*)    GFR calc non Af Amer 49 (*)    GFR calc Af Amer 57 (*)    All other components within normal limits  URINALYSIS, ROUTINE W REFLEX MICROSCOPIC - Abnormal; Notable for the following:    APPearance CLOUDY (*)    All other components within normal limits  AMMONIA - Abnormal; Notable for the following:    Ammonia 135 (*)    All other components within normal limits  GLUCOSE, CAPILLARY - Abnormal; Notable for the following:    Glucose-Capillary 145 (*)    All other components within normal limits  LIPASE, BLOOD  APTT  PROTIME-INR   Imaging Review No results found.  Room air saturation is 100% and I interpret this to be adequate.  EKG at time 18:24, shows sinus rhythm at a rate of 61, normal axis, no ST or T-wave changes, RSR Prime noted in lead V1. Early RS transition in lead V2 is noted. Otherwise no ischemic changes on EKG.  9:17 PM Spoke to Dr. Kirtland Bouchard and will be admitted to med surg  MDM   1. Hepatic encephalopathy   2. Thrombocytopenia      Patient's affect and behavior seems to support being encephalopathic. He has mild tremors diffusely. There is minimal word finding difficulties not necessarily consistent with a stroke. There no other focal deficits. His coordination is poor, however I think is related to encephalopathy. His ammonia indeed today is elevated at 135. Patient is ordered some lactulose and he would benefit from admission. Plan is to discuss with [facility]. Patient is otherwise afebrile and no symptoms suggestive of meningitis are seen.      Gavin Pound. Oletta Lamas, MD 09/21/12 2117

## 2012-09-21 NOTE — ED Notes (Signed)
Pt's wife voicing concerns of pt's blood sugar dropping as pt has hx of same and that pt is hungry. Pt's wife advised staff would recheck pts blood sugar and we would provide pt w/ sandwich bag. This RN updated pt and family on plan of care to admit pt to hospital. Pt sleeping, easily aroused to verbal stimuli and A&Ox4

## 2012-09-22 ENCOUNTER — Inpatient Hospital Stay (HOSPITAL_COMMUNITY): Payer: BC Managed Care – PPO

## 2012-09-22 DIAGNOSIS — R609 Edema, unspecified: Secondary | ICD-10-CM

## 2012-09-22 DIAGNOSIS — I059 Rheumatic mitral valve disease, unspecified: Secondary | ICD-10-CM

## 2012-09-22 LAB — BASIC METABOLIC PANEL
Chloride: 106 mEq/L (ref 96–112)
GFR calc Af Amer: 56 mL/min — ABNORMAL LOW (ref >90)
GFR calc non Af Amer: 48 mL/min — ABNORMAL LOW (ref >90)
Potassium: 4 mEq/L (ref 3.5–5.1)
Sodium: 141 mEq/L (ref 135–145)

## 2012-09-22 LAB — PRO B NATRIURETIC PEPTIDE: Pro B Natriuretic peptide (BNP): 64.5 pg/mL (ref 0–125)

## 2012-09-22 LAB — CBC
HCT: 35.3 % — ABNORMAL LOW (ref 39.0–52.0)
Hemoglobin: 12.8 g/dL — ABNORMAL LOW (ref 13.0–17.0)
MCHC: 36.3 g/dL — ABNORMAL HIGH (ref 30.0–36.0)
RBC: 3.83 MIL/uL — ABNORMAL LOW (ref 4.22–5.81)
WBC: 3.8 10*3/uL — ABNORMAL LOW (ref 4.0–10.5)

## 2012-09-22 LAB — GLUCOSE, CAPILLARY: Glucose-Capillary: 92 mg/dL (ref 70–99)

## 2012-09-22 LAB — TSH: TSH: 4.196 u[IU]/mL (ref 0.350–4.500)

## 2012-09-22 MED ORDER — ROSUVASTATIN CALCIUM 20 MG PO TABS
20.0000 mg | ORAL_TABLET | Freq: Every day | ORAL | Status: DC
  Administered 2012-09-22 – 2012-09-23 (×2): 20 mg via ORAL
  Filled 2012-09-22 (×2): qty 1

## 2012-09-22 NOTE — Progress Notes (Signed)
  Echocardiogram 2D Echocardiogram has been performed.  [name] 09/22/2012, 11:22 AM

## 2012-09-22 NOTE — Progress Notes (Signed)
Patient placed on auto CPAP via nasal mask at this time. Tolerating well, no distress noted. RT will continue to monitor.

## 2012-09-22 NOTE — Progress Notes (Signed)
*  PRELIMINARY RESULTS* Vascular Ultrasound Lower extremity venous duplex has been completed.  Preliminary findings: negative for DVT    Farrel Demark, RDMS, RVT  09/22/2012, 11:44 AM

## 2012-09-22 NOTE — Progress Notes (Signed)
   CARE MANAGEMENT NOTE 09/22/2012  Patient:  [name], [name]   Account Number:  [id number]  Date Initiated:  09/22/2012  Documentation initiated by:  Jiles Crocker  Subjective/Objective Assessment:   ADMITTED WITH HEPATIC ENCEPHALOPATHY     Action/Plan:   PCP: Johny Blamer, MD  LIVES AT HOME WITH SPOUSE, INDEPENDENT PRIOR TO ADMISSION; CM FOLLOWING FOR DCP   Anticipated DC Date:  09/29/2012   Anticipated DC Plan:  HOME/SELF CARE      DC Planning Services  CM consult              Status of service:  In process, will continue to follow Medicare Important Message given?  NA - LOS <3 / Initial given by admissions (If response is "NO", the following Medicare IM given date fields will be blank)  Per UR Regulation:  Reviewed for med. necessity/level of care/duration of stay  Comments:  9/10/2014Abelino Derrick RN,BSN,MHA [phone]

## 2012-09-22 NOTE — Progress Notes (Addendum)
[facility] PROGRESS NOTE  [name] ZOX:[id number] DOB: 1935/04/11 DOA: 09/21/2012 PCP: Johny Blamer, MD  Assessment/Plan:  77 y.o. male history of IDDM, cirrhosis of liver secondary to nonalcoholic steatohepatitis was brought to the ER after patient's wife noticed that patient has been getting increasingly confused with sleepiness and tremors   1. Hepatic encephalopathy -clinically improved; CT head no acute issues  - Patient states that he has not taken lactulose for many years because of diarrhea. At this time we will continue with rifaximin and patient has been started on lactulose. Closely follow ammonia levels. -[place]: no ascites; no s/s of infection; d/c atx  -MRI r/o  -d/c gemfibrozil ? Affecting LFTs  2. Chronic kidney disease - patient follows with Dr. Kathe Mariner of nephrology. con t monitoring  3. Hypertension - hold Cozaar because of mildly elevated creatinine given patient's history of liver disease. Patient has been placed on when necessary IV hydralazine for systolic blood pressure more than 160. 4. Obstructive sleep apnea - continue CPAP. 5. Diabetes mellitus type 2  -uncontrolled likely due to liver cirrhosis; d/c metformin due to AKI/CKD; cont insulin regimen   Code Status: full  Family Communication: wife, daughetr at the bedside  Disposition Plan: home    Consultants:  No   Procedures:  CXR   Antibiotics:  HPI/Subjective: Alert, awake;   Objective: Filed Vitals:   09/22/12 1401  BP: 118/61  Pulse: 62  Temp: 98.1 F (36.7 C)  Resp: 17    Intake/Output Summary (Last 24 hours) at 09/22/12 1451 Last data filed at 09/22/12 0900  Gross per 24 hour  Intake    240 ml  Output      1 ml  Net    239 ml   Filed Weights   09/21/12 1700 09/21/12 2300 09/22/12 0500  Weight: 90.719 kg (200 lb) 103.8 kg (228 lb 13.4 oz) 97.3 kg (214 lb 8.1 oz)    Exam:   General:  Alert, awake, oriented   Cardiovascular: S1, S2 RRR  Respiratory:  CTA bl    Abdomen: soft, ND, NT   Musculoskeletal: no LE edema    Data Reviewed: Basic Metabolic Panel:  Recent Labs Lab 09/21/12 1705 09/22/12 0518  NA 136 141  K 4.5 4.0  CL 100 106  CO2 25 25  GLUCOSE 151* 105*  BUN 27* 24*  CREATININE 1.37* 1.39*  CALCIUM 9.5 9.1   Liver Function Tests:  Recent Labs Lab 09/21/12 1705  AST 85*  ALT 46  ALKPHOS 95  BILITOT 1.2  PROT 7.7  ALBUMIN 2.8*    Recent Labs Lab 09/21/12 1945  LIPASE 57    Recent Labs Lab 09/21/12 1705  AMMONIA 135*   CBC:  Recent Labs Lab 09/21/12 1705 09/22/12 0518  WBC 4.6 3.8*  NEUTROABS 2.3  --   HGB 13.3 12.8*  HCT 38.1* 35.3*  MCV 92.5 92.2  PLT 65* 57*   Cardiac Enzymes: No results found for this basename: CKTOTAL, CKMB, CKMBINDEX, TROPONINI,  in the last 168 hours BNP (last 3 results)  Recent Labs  09/21/12 1945  PROBNP 64.5   CBG:  Recent Labs Lab 09/21/12 1712 09/21/12 2124 09/22/12 0032 09/22/12 0630 09/22/12 1237  GLUCAP 145* 99 138* 92 122*    No results found for this or any previous visit (from the past 240 hour(s)).   Studies: Ct Head Wo Contrast  09/21/2012   *RADIOLOGY REPORT*  Clinical Data: Confusion.  Tremors and shakes today with increasing confusion.  Increased sleeping.  CT HEAD WITHOUT CONTRAST  Technique:  Contiguous axial images were obtained from the base of the skull through the vertex without contrast.  Comparison: 04/02/2007  Findings: Mild cerebral atrophy.  Calcifications in the basal ganglia bilaterally and in the left pons are stable.  No ventricular dilatation.  No mass effect or midline shift.  No abnormal extra-axial fluid collections.  Gray-white matter junctions are distinct.  Basal cisterns are not effaced.  No evidence of acute intracranial hemorrhage.  No depressed skull fractures.  Visualized paranasal sinuses and mastoid air cells are clear.  Vascular calcifications.  IMPRESSION: No acute intracranial abnormalities.   Original Report  Authenticated By: Burman Nieves, M.D.   US Abdomen Limited  09/22/2012   CLINICAL DATA:  Ascites, history diabetes, nonalcoholic steatohepatitis  EXAM: US ABDOMEN LIMITED FOR ASCITES  COMPARISON:  Abdomen ultrasound 02/17/2012  FINDINGS: No ascites is identified on survey imaging of the 4 quadrants.  Bladder is normally distended.  Intra-abdominal organs were not assessed.  : No ascites identified.   Electronically Signed   By: Ulyses Southward M.D.   On: 09/22/2012 13:35   Dg Chest Port 1 View  09/21/2012   *RADIOLOGY REPORT*  Clinical Data: Check for pleural effusion.  Extreme sleepiness. Confusion.  Tremors.  History of diabetes, kidney disease.  PORTABLE CHEST - 1 VIEW  Comparison: Chest x-ray 08/19/2011, chest CT 07/01/2012  Findings: The heart is mildly enlarged.  There are no focal consolidations or pleural effusions.  No pulmonary edema.  Shallow lung inflation.  IMPRESSION: Mild cardiomegaly.   Original Report Authenticated By: Norva Pavlov, M.D.    Scheduled Meds: . cefTRIAXone (ROCEPHIN)  IV  1 g Intravenous Q24H  . darifenacin  7.5 mg Oral Daily  . ferrous sulfate  325 mg Oral QHS  . gemfibrozil  600 mg Oral BID AC  . insulin aspart  0-9 Units Subcutaneous TID WC  . insulin detemir  80 Units Subcutaneous BID  . lactulose  30 g Oral TID  . pantoprazole  40 mg Oral Daily  . propranolol  10 mg Oral BID  . rifaximin  550 mg Oral BID  . rosuvastatin  20 mg Oral q1800  . sodium chloride  3 mL Intravenous Q12H  . venlafaxine  75 mg Oral BID   Continuous Infusions:   Principal Problem:   Hepatic encephalopathy Active Problems:   OBSTRUCTIVE SLEEP APNEA   Diabetes mellitus   HTN (hypertension)    Time spent: > 35 minutes     Esperanza Sheets  [facility] Pager [phone]. If 7PM-7AM, please contact night-coverage at [email or web address], password [facility] 09/22/2012, 2:51 PM  LOS: 1 day

## 2012-09-23 ENCOUNTER — Inpatient Hospital Stay (HOSPITAL_COMMUNITY): Payer: BC Managed Care – PPO

## 2012-09-23 DIAGNOSIS — G4733 Obstructive sleep apnea (adult) (pediatric): Secondary | ICD-10-CM

## 2012-09-23 LAB — BASIC METABOLIC PANEL
BUN: 22 mg/dL (ref 6–23)
CO2: 22 mEq/L (ref 19–32)
Calcium: 8.7 mg/dL (ref 8.4–10.5)
Glucose, Bld: 113 mg/dL — ABNORMAL HIGH (ref 70–99)
Potassium: 4.2 mEq/L (ref 3.5–5.1)
Sodium: 135 mEq/L (ref 135–145)

## 2012-09-23 LAB — GLUCOSE, CAPILLARY
Glucose-Capillary: 218 mg/dL — ABNORMAL HIGH (ref 70–99)
Glucose-Capillary: 208 mg/dL — ABNORMAL HIGH (ref 70–99)

## 2012-09-23 MED ORDER — LACTULOSE 10 GM/15ML PO SOLN: 30.0000 g | Freq: Three times a day (TID) | ORAL | Status: DC

## 2012-09-23 NOTE — Discharge Summary (Signed)
Physician Discharge Summary  [name] XBJ:[id number] DOB: [birth year] DOA: 09/21/2012  PCP: Johny Blamer, MD  Admit date: 09/21/2012 Discharge date: 09/23/2012  Time spent: > 35 minutes  minutes  Recommendations for Outpatient Follow-up:  F/u with PCP in 1 week   Discharge Diagnoses:  Principal Problem:   Hepatic encephalopathy Active Problems:   OBSTRUCTIVE SLEEP APNEA   Diabetes mellitus   HTN (hypertension)   Discharge Condition: stable   Diet recommendation: heart healthy   Filed Weights   09/21/12 2300 09/22/12 0500 09/23/12 0500  Weight: 103.8 kg (228 lb 13.4 oz) 97.3 kg (214 lb 8.1 oz) 101.9 kg (224 lb 10.4 oz)    History of present illness:  77 y.o. male history of IDDM, cirrhosis of liver secondary to nonalcoholic steatohepatitis was brought to the ER after patient's wife noticed that patient has been getting increasingly confused with sleepiness and tremors   Hospital Course:  1. Hepatic encephalopathy -clinically improved; CT head no acute issues  - Patient states that he has not taken lactulose for many years because of diarrhea.  -d/c gemfibrozil ? Affecting LFTs  -clinically improved, no confusion; ammonia decreased with lactulose+rifaximin; -reemphasized medication compliance;  -pend MRI head; possible d/c today, and follow up with PCP, and GI outpatient   2. Chronic kidney disease - patient follows with Dr. Kathe Mariner of nephrology. con t monitoring  3. Hypertension - stable; . 4. Obstructive sleep apnea - continue CPAP. 5. Diabetes mellitus type 2  -uncontrolled likely due to liver cirrhosis;  -d/c metformin due to AKI/CKD; cont insulin regimen outpatient titration   Possible d/c today after MRI if normal   Procedures: CT, CXR  Consultations:  No   Discharge Exam: Filed Vitals:   09/23/12 0538  BP: 133/66  Pulse: 68  Temp: 98.9 F (37.2 C)  Resp: 20    General: alert, oriented  Cardiovascular: S1, S2 RRR Respiratory: CTA BL    Discharge Instructions  Discharge Orders   Future Orders Complete By Expires   Diet - low sodium heart healthy  As directed    Discharge instructions  As directed    Comments:     Follow up with primary care doctor in 1 week   Increase activity slowly  As directed        Medication List    STOP taking these medications       gemfibrozil 600 MG tablet  Commonly known as:  LOPID     metFORMIN 500 MG tablet  Commonly known as:  GLUCOPHAGE      TAKE these medications       aMILoride 5 MG tablet  Commonly known as:  MIDAMOR  Take 5 mg by mouth daily.     aspirin EC 81 MG tablet  Take 81 mg by mouth 2 (two) times daily.     cholecalciferol 1000 UNITS tablet  Commonly known as:  VITAMIN D  Take 1,000 Units by mouth every morning.     clotrimazole-betamethasone cream  Commonly known as:  LOTRISONE  Apply 1 application topically 2 (two) times daily.     ferrous sulfate 325 (65 FE) MG tablet  Take 325 mg by mouth at bedtime.     lactulose 10 GM/15ML solution  Commonly known as:  CHRONULAC  Take 45 mLs (30 g total) by mouth 3 (three) times daily.     LEVEMIR FLEXPEN 100 UNIT/ML Sopn  Generic drug:  Insulin Detemir  Inject 80 Units into the skin 2 (two) times daily.  Sliding scale as directed     losartan 100 MG tablet  Commonly known as:  COZAAR  Take 50 mg by mouth daily.     multivitamin with minerals Tabs tablet  Take 1 tablet by mouth daily.     NOVOLOG FLEXPEN 100 UNIT/ML Sopn FlexPen  Generic drug:  insulin aspart  Inject 32-50 Units into the skin 3 (three) times daily with meals. 32 units if blood sugar is 70-200. Take 50 units if blood sugar is above 200.     omeprazole 20 MG capsule  Commonly known as:  PRILOSEC  Take 20 mg by mouth daily.     PRESCRIPTION MEDICATION  Apply 1 application topically daily as needed. Eucerin/Triamcinolone 0.1% apply for dry skin     PROBIOTIC DAILY Caps  Take 2 capsules by mouth every morning.     propranolol 10  MG tablet  Commonly known as:  INDERAL  Take 10 mg by mouth 2 (two) times daily.     rifaximin 550 MG Tabs tablet  Commonly known as:  XIFAXAN  Take 550 mg by mouth 2 (two) times daily.     rosuvastatin 20 MG tablet  Commonly known as:  CRESTOR  Take 20 mg by mouth every morning.     solifenacin 5 MG tablet  Commonly known as:  VESICARE  Take 5 mg by mouth daily.     venlafaxine 75 MG tablet  Commonly known as:  EFFEXOR  Take 75 mg by mouth 2 (two) times daily.       Allergies  Allergen Reactions  . Aldactone [Spironolactone] Swelling    Breast swell       Follow-up Information   Follow up with Johny Blamer, MD In 1 week.   Specialty:  Family Medicine   Contact information:   [facility] [address] [place] [address] [phone]        The results of significant diagnostics from this hospitalization (including imaging, microbiology, ancillary and laboratory) are listed below for reference.    Significant Diagnostic Studies: Ct Head Wo Contrast  09/21/2012   *RADIOLOGY REPORT*  Clinical Data: Confusion.  Tremors and shakes today with increasing confusion.  Increased sleeping.  CT HEAD WITHOUT CONTRAST  Technique:  Contiguous axial images were obtained from the base of the skull through the vertex without contrast.  Comparison: 04/02/2007  Findings: Mild cerebral atrophy.  Calcifications in the basal ganglia bilaterally and in the left pons are stable.  No ventricular dilatation.  No mass effect or midline shift.  No abnormal extra-axial fluid collections.  Gray-white matter junctions are distinct.  Basal cisterns are not effaced.  No evidence of acute intracranial hemorrhage.  No depressed skull fractures.  Visualized paranasal sinuses and mastoid air cells are clear.  Vascular calcifications.  IMPRESSION: No acute intracranial abnormalities.   Original Report Authenticated By: Burman Nieves, M.D.   US Abdomen  Limited  09/22/2012   CLINICAL DATA:  Ascites, history diabetes, nonalcoholic steatohepatitis  EXAM: US ABDOMEN LIMITED FOR ASCITES  COMPARISON:  Abdomen ultrasound 02/17/2012  FINDINGS: No ascites is identified on survey imaging of the 4 quadrants.  Bladder is normally distended.  Intra-abdominal organs were not assessed.  : No ascites identified.   Electronically Signed   By: Ulyses Southward M.D.   On: 09/22/2012 13:35   Dg Chest Port 1 View  09/21/2012   *RADIOLOGY REPORT*  Clinical Data: Check for pleural effusion.  Extreme sleepiness. Confusion.  Tremors.  History of  diabetes, kidney disease.  PORTABLE CHEST - 1 VIEW  Comparison: Chest x-ray 08/19/2011, chest CT 07/01/2012  Findings: The heart is mildly enlarged.  There are no focal consolidations or pleural effusions.  No pulmonary edema.  Shallow lung inflation.  IMPRESSION: Mild cardiomegaly.   Original Report Authenticated By: Norva Pavlov, M.D.    Microbiology: No results found for this or any previous visit (from the past 240 hour(s)).   Labs: Basic Metabolic Panel:  Recent Labs Lab 09/21/12 1705 09/22/12 0518 09/23/12 0640  NA 136 141 135  K 4.5 4.0 4.2  CL 100 106 101  CO2 25 25 22   GLUCOSE 151* 105* 113*  BUN 27* 24* 22  CREATININE 1.37* 1.39* 1.33  CALCIUM 9.5 9.1 8.7   Liver Function Tests:  Recent Labs Lab 09/21/12 1705  AST 85*  ALT 46  ALKPHOS 95  BILITOT 1.2  PROT 7.7  ALBUMIN 2.8*    Recent Labs Lab 09/21/12 1945  LIPASE 57    Recent Labs Lab 09/21/12 1705 09/23/12 0640  AMMONIA 135* 83*   CBC:  Recent Labs Lab 09/21/12 1705 09/22/12 0518  WBC 4.6 3.8*  NEUTROABS 2.3  --   HGB 13.3 12.8*  HCT 38.1* 35.3*  MCV 92.5 92.2  PLT 65* 57*   Cardiac Enzymes: No results found for this basename: CKTOTAL, CKMB, CKMBINDEX, TROPONINI,  in the last 168 hours BNP: BNP (last 3 results)  Recent Labs  09/21/12 1945  PROBNP 64.5   CBG:  Recent Labs Lab 09/22/12 1237 09/22/12 1645  09/22/12 2224 09/23/12 0627 09/23/12 1135  GLUCAP 122* 159* 128* 106* 218*       Signed:  Brittanya Winburn N  [facility] 09/23/2012, 3:02 PM

## 2012-09-23 NOTE — Progress Notes (Signed)
[facility] PROGRESS NOTE  [name] HQI:[id number] DOB: 1935-07-01 DOA: 09/21/2012 PCP: Johny Blamer, MD  Assessment/Plan:  77 y.o. male history of IDDM, cirrhosis of liver secondary to nonalcoholic steatohepatitis was brought to the ER after patient's wife noticed that patient has been getting increasingly confused with sleepiness and tremors   1. Hepatic encephalopathy -clinically improved; CT head no acute issues  - Patient states that he has not taken lactulose for many years because of diarrhea. At this time we will continue with rifaximin and patient has been started on lactulose. Closely follow ammonia levels. -[place]: no ascites; no s/s of infection; d/c atx  -MRI r/o pend  -d/c gemfibrozil ? Affecting LFTs  2. Chronic kidney disease - patient follows with Dr. Kathe Mariner of nephrology. con t monitoring  3. Hypertension - hold Cozaar because of mildly elevated creatinine given patient's history of liver disease. Patient has been placed on when necessary IV hydralazine for systolic blood pressure more than 160. 4. Obstructive sleep apnea - continue CPAP. 5. Diabetes mellitus type 2  -uncontrolled likely due to liver cirrhosis; d/c metformin due to AKI/CKD; cont insulin regimen   Code Status: full  Family Communication: wife, daughetr at the bedside  Disposition Plan: home likely today    Consultants:  No   Procedures:  CXR   Antibiotics:  HPI/Subjective: Alert, awake;   Objective: Filed Vitals:   09/23/12 0538  BP: 133/66  Pulse: 68  Temp: 98.9 F (37.2 C)  Resp: 20    Intake/Output Summary (Last 24 hours) at 09/23/12 0844 Last data filed at 09/22/12 0900  Gross per 24 hour  Intake    240 ml  Output      0 ml  Net    240 ml   Filed Weights   09/21/12 2300 09/22/12 0500 09/23/12 0500  Weight: 103.8 kg (228 lb 13.4 oz) 97.3 kg (214 lb 8.1 oz) 101.9 kg (224 lb 10.4 oz)    Exam:   General:  Alert, awake, oriented   Cardiovascular: S1, S2  RRR  Respiratory:  CTA bl   Abdomen: soft, ND, NT   Musculoskeletal: no LE edema    Data Reviewed: Basic Metabolic Panel:  Recent Labs Lab 09/21/12 1705 09/22/12 0518 09/23/12 0640  NA 136 141 135  K 4.5 4.0 4.2  CL 100 106 101  CO2 25 25 22   GLUCOSE 151* 105* 113*  BUN 27* 24* 22  CREATININE 1.37* 1.39* 1.33  CALCIUM 9.5 9.1 8.7   Liver Function Tests:  Recent Labs Lab 09/21/12 1705  AST 85*  ALT 46  ALKPHOS 95  BILITOT 1.2  PROT 7.7  ALBUMIN 2.8*    Recent Labs Lab 09/21/12 1945  LIPASE 57    Recent Labs Lab 09/21/12 1705 09/23/12 0640  AMMONIA 135* 83*   CBC:  Recent Labs Lab 09/21/12 1705 09/22/12 0518  WBC 4.6 3.8*  NEUTROABS 2.3  --   HGB 13.3 12.8*  HCT 38.1* 35.3*  MCV 92.5 92.2  PLT 65* 57*   Cardiac Enzymes: No results found for this basename: CKTOTAL, CKMB, CKMBINDEX, TROPONINI,  in the last 168 hours BNP (last 3 results)  Recent Labs  09/21/12 1945  PROBNP 64.5   CBG:  Recent Labs Lab 09/22/12 0630 09/22/12 1237 09/22/12 1645 09/22/12 2224 09/23/12 0627  GLUCAP 92 122* 159* 128* 106*    No results found for this or any previous visit (from the past 240 hour(s)).   Studies: Ct Head Wo Contrast  09/21/2012   *RADIOLOGY REPORT*  Clinical Data: Confusion.  Tremors and shakes today with increasing confusion.  Increased sleeping.  CT HEAD WITHOUT CONTRAST  Technique:  Contiguous axial images were obtained from the base of the skull through the vertex without contrast.  Comparison: 04/02/2007  Findings: Mild cerebral atrophy.  Calcifications in the basal ganglia bilaterally and in the left pons are stable.  No ventricular dilatation.  No mass effect or midline shift.  No abnormal extra-axial fluid collections.  Gray-white matter junctions are distinct.  Basal cisterns are not effaced.  No evidence of acute intracranial hemorrhage.  No depressed skull fractures.  Visualized paranasal sinuses and mastoid air cells are clear.   Vascular calcifications.  IMPRESSION: No acute intracranial abnormalities.   Original Report Authenticated By: Burman Nieves, M.D.   US Abdomen Limited  09/22/2012   CLINICAL DATA:  Ascites, history diabetes, nonalcoholic steatohepatitis  EXAM: US ABDOMEN LIMITED FOR ASCITES  COMPARISON:  Abdomen ultrasound 02/17/2012  FINDINGS: No ascites is identified on survey imaging of the 4 quadrants.  Bladder is normally distended.  Intra-abdominal organs were not assessed.  : No ascites identified.   Electronically Signed   By: Ulyses Southward M.D.   On: 09/22/2012 13:35   Dg Chest Port 1 View  09/21/2012   *RADIOLOGY REPORT*  Clinical Data: Check for pleural effusion.  Extreme sleepiness. Confusion.  Tremors.  History of diabetes, kidney disease.  PORTABLE CHEST - 1 VIEW  Comparison: Chest x-ray 08/19/2011, chest CT 07/01/2012  Findings: The heart is mildly enlarged.  There are no focal consolidations or pleural effusions.  No pulmonary edema.  Shallow lung inflation.  IMPRESSION: Mild cardiomegaly.   Original Report Authenticated By: Norva Pavlov, M.D.    Scheduled Meds: . darifenacin  7.5 mg Oral Daily  . ferrous sulfate  325 mg Oral QHS  . insulin aspart  0-9 Units Subcutaneous TID WC  . insulin detemir  80 Units Subcutaneous BID  . lactulose  30 g Oral TID  . pantoprazole  40 mg Oral Daily  . propranolol  10 mg Oral BID  . rifaximin  550 mg Oral BID  . rosuvastatin  20 mg Oral q1800  . sodium chloride  3 mL Intravenous Q12H  . venlafaxine  75 mg Oral BID   Continuous Infusions:   Principal Problem:   Hepatic encephalopathy Active Problems:   OBSTRUCTIVE SLEEP APNEA   Diabetes mellitus   HTN (hypertension)    Time spent: > 35 minutes     Esperanza Sheets  [facility] Pager [phone]. If 7PM-7AM, please contact night-coverage at [email or web address], password [facility] 09/23/2012, 8:44 AM  LOS: 2 days

## 2012-10-22 ENCOUNTER — Other Ambulatory Visit: Payer: Self-pay | Admitting: Gastroenterology

## 2012-10-22 DIAGNOSIS — K7460 Unspecified cirrhosis of liver: Secondary | ICD-10-CM

## 2012-10-28 ENCOUNTER — Other Ambulatory Visit: Payer: BC Managed Care – PPO

## 2012-10-29 ENCOUNTER — Encounter (HOSPITAL_COMMUNITY): Payer: Self-pay | Admitting: Pharmacy Technician

## 2012-11-01 ENCOUNTER — Ambulatory Visit
Admission: RE | Admit: 2012-11-01 | Discharge: 2012-11-01 | Disposition: A | Discharge status: Home / Self Care | Payer: BC Managed Care – PPO | Source: Ambulatory Visit | Attending: Gastroenterology | Admitting: Gastroenterology

## 2012-11-01 DIAGNOSIS — K7460 Unspecified cirrhosis of liver: Secondary | ICD-10-CM

## 2012-11-05 ENCOUNTER — Encounter (HOSPITAL_COMMUNITY): Payer: Self-pay | Admitting: *Deleted

## 2012-11-05 NOTE — Progress Notes (Signed)
Labs work from [facility] from 10/22/2012 Pt, CBC with Diff.,CMET,AFP,SERUM,Tumor marker on chart.

## 2012-11-18 ENCOUNTER — Encounter (HOSPITAL_COMMUNITY)
Admission: RE | Disposition: A | Discharge status: Home / Self Care | Payer: Self-pay | Source: Ambulatory Visit | Attending: Gastroenterology

## 2012-11-18 ENCOUNTER — Other Ambulatory Visit: Payer: Self-pay

## 2012-11-18 ENCOUNTER — Encounter (HOSPITAL_COMMUNITY): Payer: BC Managed Care – PPO | Admitting: Anesthesiology

## 2012-11-18 ENCOUNTER — Ambulatory Visit (HOSPITAL_COMMUNITY): Payer: BC Managed Care – PPO | Admitting: Anesthesiology

## 2012-11-18 ENCOUNTER — Encounter (HOSPITAL_COMMUNITY): Payer: Self-pay | Admitting: *Deleted

## 2012-11-18 ENCOUNTER — Other Ambulatory Visit: Payer: Self-pay | Admitting: Gastroenterology

## 2012-11-18 ENCOUNTER — Ambulatory Visit (HOSPITAL_COMMUNITY)
Admission: RE | Admit: 2012-11-18 | Discharge: 2012-11-18 | Disposition: A | Discharge status: Home / Self Care | Payer: BC Managed Care – PPO | Source: Ambulatory Visit | Attending: Gastroenterology | Admitting: Gastroenterology

## 2012-11-18 DIAGNOSIS — Z8711 Personal history of peptic ulcer disease: Secondary | ICD-10-CM | POA: Insufficient documentation

## 2012-11-18 DIAGNOSIS — I8500 Esophageal varices without bleeding: Secondary | ICD-10-CM | POA: Insufficient documentation

## 2012-11-18 DIAGNOSIS — Z860100 Personal history of colon polyps, unspecified: Secondary | ICD-10-CM | POA: Insufficient documentation

## 2012-11-18 DIAGNOSIS — K7689 Other specified diseases of liver: Secondary | ICD-10-CM | POA: Insufficient documentation

## 2012-11-18 DIAGNOSIS — K7460 Unspecified cirrhosis of liver: Secondary | ICD-10-CM | POA: Insufficient documentation

## 2012-11-18 DIAGNOSIS — R195 Other fecal abnormalities: Secondary | ICD-10-CM | POA: Insufficient documentation

## 2012-11-18 DIAGNOSIS — K219 Gastro-esophageal reflux disease without esophagitis: Secondary | ICD-10-CM | POA: Insufficient documentation

## 2012-11-18 DIAGNOSIS — K5730 Diverticulosis of large intestine without perforation or abscess without bleeding: Secondary | ICD-10-CM | POA: Insufficient documentation

## 2012-11-18 DIAGNOSIS — D126 Benign neoplasm of colon, unspecified: Secondary | ICD-10-CM | POA: Insufficient documentation

## 2012-11-18 DIAGNOSIS — E119 Type 2 diabetes mellitus without complications: Secondary | ICD-10-CM | POA: Insufficient documentation

## 2012-11-18 DIAGNOSIS — I10 Essential (primary) hypertension: Secondary | ICD-10-CM | POA: Insufficient documentation

## 2012-11-18 DIAGNOSIS — Z794 Long term (current) use of insulin: Secondary | ICD-10-CM | POA: Insufficient documentation

## 2012-11-18 DIAGNOSIS — G4730 Sleep apnea, unspecified: Secondary | ICD-10-CM | POA: Insufficient documentation

## 2012-11-18 HISTORY — PX: ESOPHAGOGASTRODUODENOSCOPY (EGD) WITH PROPOFOL: SHX5813

## 2012-11-18 HISTORY — DX: Sleep apnea, unspecified: G47.30

## 2012-11-18 HISTORY — PX: COLONOSCOPY WITH PROPOFOL: SHX5780

## 2012-11-18 HISTORY — DX: Essential (primary) hypertension: I10

## 2012-11-18 HISTORY — DX: Anxiety disorder, unspecified: F41.9

## 2012-11-18 HISTORY — DX: Depression, unspecified: F32.A

## 2012-11-18 HISTORY — DX: Esophageal varices without bleeding: I85.00

## 2012-11-18 HISTORY — DX: Gastro-esophageal reflux disease without esophagitis: K21.9

## 2012-11-18 HISTORY — DX: Major depressive disorder, single episode, unspecified: F32.9

## 2012-11-18 HISTORY — DX: Anemia, unspecified: D64.9

## 2012-11-18 SURGERY — ESOPHAGOGASTRODUODENOSCOPY (EGD) WITH PROPOFOL
Anesthesia: Monitor Anesthesia Care

## 2012-11-18 MED ORDER — SODIUM CHLORIDE 0.9 % IV SOLN: INTRAVENOUS | Status: DC

## 2012-11-18 MED ORDER — KETAMINE HCL 10 MG/ML IJ SOLN
INTRAMUSCULAR | Status: DC | PRN
  Administered 2012-11-18: 20 mg via INTRAVENOUS

## 2012-11-18 MED ORDER — LACTATED RINGERS IV SOLN
INTRAVENOUS | Status: DC
  Administered 2012-11-18: 1000 mL via INTRAVENOUS

## 2012-11-18 MED ORDER — PROPOFOL INFUSION 10 MG/ML OPTIME
INTRAVENOUS | Status: DC | PRN
  Administered 2012-11-18: 60 ug/kg/min via INTRAVENOUS

## 2012-11-18 MED ORDER — MIDAZOLAM HCL 5 MG/5ML IJ SOLN
INTRAMUSCULAR | Status: DC | PRN
  Administered 2012-11-18: 2 mg via INTRAVENOUS

## 2012-11-18 MED ORDER — BUTAMBEN-TETRACAINE-BENZOCAINE 2-2-14 % EX AERO
INHALATION_SPRAY | CUTANEOUS | Status: DC | PRN
  Administered 2012-11-18: 2 via TOPICAL

## 2012-11-18 SURGICAL SUPPLY — 25 items

## 2012-11-18 NOTE — Op Note (Signed)
[facility] [address] [place], [address]   ENDOSCOPY PROCEDURE REPORT  PATIENT: Panayiotis, [place]  MR#: [id number] BIRTHDATE: Sep 08, 1935 , 77  yrs. old GENDER: Male ENDOSCOPIST:Saahil Herbster, MD REFERRED BY:  Dr. Clair Gulling [facility] Division of Hepatology), Dr. Arvella Merles PROCEDURE DATE:  11/18/2012 PROCEDURE:      upper endoscopy ASA CLASS: INDICATIONS: heme positive stool. History of cirrhosis related to nonalcoholic steatohepatitis, monitor for varices (moderate varices noted several years ago) MEDICATION:    MAC per anesthesia TOPICAL ANESTHETIC:    Cetacaine spray  DESCRIPTION OF PROCEDURE:   the patient came as an outpatient to the [facility] long endoscopy unit. He provided written consent. Time out was performed. The patient was sedated by anesthesia and remained stable throughout the procedure.  The Pentax adult video endoscope was passed under direct vision. The vocal cords were unremarkable. The esophagus was entered without significant difficulty. The proximal esophagus was normal, the midesophagus had minimal varices which would flatten out with insufflation, and the distal esophagus had moderate varices, one of which, on the right side, was fairly prominent where as the others with essentially flatten out with insufflation.  No Mallory-Weiss tear, neoplasia, or reflux esophagitis were noted and there was no significant hiatal hernia.  The stomach was entered. It contained no blood or coffee ground material. It has been some days since the patient last took his aspirin, and there was no evidence at this time of aspirin-related gastropathy such as significant erythema, erosions, or ulcers. A retroflexion the cardia did not show any gastric varices.  The pylorus, duodenal bulb, and second duodenum looked normal.  The scope was removed from the patient. No biopsies were obtained. He tolerated the procedure  well.     COMPLICATIONS: None  ENDOSCOPIC IMPRESSION:  1.No source of heme positivity identified on current examination 2. Moderate distal esophageal varix, mild proximal esophageal varices as described above  RECOMMENDATIONS:  1. Okay to resume aspirin 2. I will discuss with patient and his wife the pros and cons of prophylactic variceal banding in this patient who has never had a variceal bleed    _______________________________ eSignedBernette Redbird, MD 11/18/2012 11:04 AM    PATIENT NAME:  Edsel, Shives MR#: [id number]

## 2012-11-18 NOTE — H&P (Signed)
  Is a 77 year old gentleman history of cirrhosis related to nonalcoholic steatohepatitis comes in for endoscopic and fluoroscopic evaluation in view of recently Hemoccult positive stool, per the request of his attending hepatologist at [facility]. He is not anemic.  Allergies: Spiral active  Outpatient medications Vesicare, multiple vitamins, Cozaar, Crestor, 81 mg aspirin, amiloride, venlafaxine, omeprazole, furosemide, side effects and, lactulose, propranolol, canceled  Operations no abdominal surgery  Medical illnesses: Cirrhosis due to nonalcoholic steatohepatitis. Type 2 diabetes. Hypertension. History of adenomatous colonic polyp in the past, most recently a 6 mm adenoma in 3 years ago, history of gallstones, history of small gastric ulcer, hepatic encephalopathy but no history of GI bleeding  Physical exam: The patient has stable vital signs, is awake, alert, and coherent. He is anicteric and without pallor. No peripheral edema. Chest clear. Heart has a regular rhythm, no murmurs. Abdomen somewhat adipose but nontender. I do not think there is ascites because there appears to be some mild tympani to percussion of the flanks.  Impression: History of Hemoccult-positive stool  Plan: Endoscopy to screen for varices and evaluation heme positivity (on aspirin). Colonoscopy for colon polyp surveillance and evaluation of heme positive stool.  Florencia Reasons, M.D. [phone]

## 2012-11-18 NOTE — Transfer of Care (Signed)
Immediate Anesthesia Transfer of Care Note  Patient: [name]  Procedure(s) Performed: Procedure(s): ESOPHAGOGASTRODUODENOSCOPY (EGD) WITH PROPOFOL (N/A) COLONOSCOPY WITH PROPOFOL (N/A)  Patient Location: PACU  Anesthesia Type:MAC  Level of Consciousness: sedated  Airway & Oxygen Therapy: Patient Spontanous Breathing and Patient connected to nasal cannula oxygen  Post-op Assessment: Report given to PACU RN and Post -op Vital signs reviewed and stable  Post vital signs: Reviewed and stable  Complications: No apparent anesthesia complications

## 2012-11-18 NOTE — Anesthesia Preprocedure Evaluation (Addendum)
Anesthesia Evaluation  Patient identified by MRN, date of birth, ID band Patient awake    Reviewed: Allergy & Precautions, H&P , NPO status , Patient's Chart, lab work & pertinent test results  Airway Mallampati: II TM Distance: >3 FB Neck ROM: Full    Dental no notable dental hx.    Pulmonary sleep apnea and Continuous Positive Airway Pressure Ventilation ,  breath sounds clear to auscultation  Pulmonary exam normal       Cardiovascular hypertension, Pt. on medications Rhythm:Regular Rate:Normal     Neuro/Psych negative neurological ROS  negative psych ROS   GI/Hepatic Neg liver ROS, GERD-  Medicated,Hepatic encephalopathy 9/14   Endo/Other  diabetes, Insulin Dependent  Renal/GU negative Renal ROS  negative genitourinary   Musculoskeletal negative musculoskeletal ROS (+)   Abdominal   Peds negative pediatric ROS (+)  Hematology  (+) Blood dyscrasia, anemia , plts 65K   Anesthesia Other Findings   Reproductive/Obstetrics negative OB ROS                         Anesthesia Physical Anesthesia Plan  ASA: III  Anesthesia Plan: MAC   Post-op Pain Management:    Induction: Intravenous  Airway Management Planned: Nasal Cannula  Additional Equipment:   Intra-op Plan:   Post-operative Plan:   Informed Consent: I have reviewed the patients History and Physical, chart, labs and discussed the procedure including the risks, benefits and alternatives for the proposed anesthesia with the patient or authorized representative who has indicated his/her understanding and acceptance.   Dental advisory given  Plan Discussed with: CRNA and Surgeon  Anesthesia Plan Comments:         Anesthesia Quick Evaluation

## 2012-11-18 NOTE — Anesthesia Postprocedure Evaluation (Signed)
  Anesthesia Post-op Note  Patient: [name]  Procedure(s) Performed: Procedure(s) (LRB): ESOPHAGOGASTRODUODENOSCOPY (EGD) WITH PROPOFOL (N/A) COLONOSCOPY WITH PROPOFOL (N/A)  Patient Location: PACU  Anesthesia Type: MAC  Level of Consciousness: awake and alert   Airway and Oxygen Therapy: Patient Spontanous Breathing  Post-op Pain: mild  Post-op Assessment: Post-op Vital signs reviewed, Patient's Cardiovascular Status Stable, Respiratory Function Stable, Patent Airway and No signs of Nausea or vomiting  Last Vitals:  Filed Vitals:   11/18/12 1049  BP: 131/69  Pulse:   Temp:   Resp: 10    Post-op Vital Signs: stable   Complications: No apparent anesthesia complications

## 2012-11-18 NOTE — Op Note (Signed)
[facility] [address] [place], [address]   COLONOSCOPY PROCEDURE REPORT  PATIENT: [name]  MR#: [id number] BIRTHDATE: [birth year] , 77  yrs. old GENDER: Male ENDOSCOPIST: Bernette Redbird, MD REFERRED BY:   Dr. Clair Gulling [facility]), Dr. Arvella Merles PROCEDURE DATE:  11/18/2012 PROCEDURE:     Colonoscopy with polypectomy ASA CLASS: INDICATIONS:  heme positive stool. Prior history of colonic adenomas, most recently a 6 mm adenoma removed in July 2011 MEDICATIONS:   MAC per anesthesia  DESCRIPTION OF PROCEDURE:  The patient came as an outpatient to the [facility] long endoscopy unit. This procedure was performed immediately following this upper endoscopy. He had provided written consent and time out had been performed. He received sedation by anesthesia and remained stable throughout the procedure.  Digital exam the prostate was unremarkable. No perianal disease was appreciated.  The Pentax adult video colonoscope was advanced with moderate difficulty through an angulated, somewhat fixated sigmoid region with a moderate amount of associated diverticular disease. Once past that area, he was fairly easy to advance around the colon to the cecum which was identified by visualization of the appendiceal orifice and, I believe, the ileal cecal valve orifice as well although I could not get the scope to enter the terminal ileum.  Pullback was then performed. The quality of the prep was fair. I had to do quite a bit of irrigation, filling up an entire suction canister. However, it is felt that all areas of colon were adequately visualized and that no significant lesions would have been missed on account of the prep.  In the descending colon were 4 small to medium size sessile polyps, ranging in size from approximately 4 mm Peters to approximately 10 mm. Each of these was removed by cold snare. A couple of these oozed for  several minutes, with an estimated blood loss of perhaps 3 ML's total. I was prepared to inject epinephrine but by the time we of this range prepared, complete spontaneous hemostasis have occurred, so injection therapy was performed.  In the sigmoid region there was a moderate amount of diverticulosis.  There was some patchy erythema in the proximal colon.  However, no source of heme positivity was identified on this exam. I did not see any diffuse colitis, vascular malformations, large polyps, or masses. There was a minimal friability in the rectosigmoid region. Retroflexion in the rectum was unremarkable.  3 of the 4 polyps snared off were retrieved for histology. One may have been lost, although more than 3 fragments of tissue were in the trap at the end so it is not clear whether this reflected fragmentation or successful retrieval of all 4 polyps.         COMPLICATIONS: None  ENDOSCOPIC IMPRESSION:  1. No source of heme positivity identified on this exam. 2. Several small and medium-sized polyps removed from the proximal colon. 3. Moderate sigmoid diverticulosis  RECOMMENDATIONS:  await pathology on today's polyps. In view of the patient's advanced age and medical comorbidities, it is not clear whether he will be an appropriate candidate for surveillance 3 years from now, but we will tentatively put him on our list for 3 years if the polyps are adenomatous in character   _______________________________ eSigned:  Bernette Redbird, MD 11/18/2012 10:57 AM     PATIENT NAME:  Ronnie, Hatfield MR#: [id number]

## 2012-11-22 ENCOUNTER — Encounter (HOSPITAL_COMMUNITY): Payer: Self-pay | Admitting: Gastroenterology

## 2013-05-05 ENCOUNTER — Other Ambulatory Visit: Payer: Self-pay | Admitting: Gastroenterology

## 2013-05-05 DIAGNOSIS — K7460 Unspecified cirrhosis of liver: Secondary | ICD-10-CM

## 2013-05-13 ENCOUNTER — Ambulatory Visit
Admission: RE | Admit: 2013-05-13 | Discharge: 2013-05-13 | Disposition: A | Discharge status: Home / Self Care | Payer: BC Managed Care – PPO | Source: Ambulatory Visit | Attending: Gastroenterology | Admitting: Gastroenterology

## 2013-05-13 DIAGNOSIS — K7460 Unspecified cirrhosis of liver: Secondary | ICD-10-CM

## 2013-08-29 ENCOUNTER — Other Ambulatory Visit: Payer: Self-pay | Admitting: Gastroenterology

## 2013-08-29 DIAGNOSIS — K7460 Unspecified cirrhosis of liver: Secondary | ICD-10-CM

## 2013-08-29 DIAGNOSIS — R109 Unspecified abdominal pain: Secondary | ICD-10-CM

## 2013-09-06 ENCOUNTER — Ambulatory Visit
Admission: RE | Admit: 2013-09-06 | Discharge: 2013-09-06 | Disposition: A | Discharge status: Home / Self Care | Payer: BC Managed Care – PPO | Source: Ambulatory Visit | Attending: Gastroenterology | Admitting: Gastroenterology

## 2013-09-06 DIAGNOSIS — K7460 Unspecified cirrhosis of liver: Secondary | ICD-10-CM

## 2013-10-28 ENCOUNTER — Other Ambulatory Visit: Payer: Self-pay

## 2013-12-01 ENCOUNTER — Other Ambulatory Visit: Payer: Self-pay | Admitting: Family Medicine

## 2013-12-01 DIAGNOSIS — G3184 Mild cognitive impairment, so stated: Secondary | ICD-10-CM

## 2013-12-10 ENCOUNTER — Other Ambulatory Visit: Payer: BC Managed Care – PPO

## 2013-12-10 ENCOUNTER — Ambulatory Visit
Admission: RE | Admit: 2013-12-10 | Discharge: 2013-12-10 | Disposition: A | Discharge status: Home / Self Care | Payer: BC Managed Care – PPO | Source: Ambulatory Visit | Attending: Family Medicine | Admitting: Family Medicine

## 2013-12-10 DIAGNOSIS — G3184 Mild cognitive impairment, so stated: Secondary | ICD-10-CM

## 2013-12-14 ENCOUNTER — Other Ambulatory Visit: Payer: BC Managed Care – PPO

## 2013-12-17 ENCOUNTER — Other Ambulatory Visit: Payer: BC Managed Care – PPO

## 2013-12-18 ENCOUNTER — Other Ambulatory Visit: Payer: BC Managed Care – PPO

## 2014-02-15 ENCOUNTER — Inpatient Hospital Stay (HOSPITAL_COMMUNITY)
Admission: EM | Admit: 2014-02-15 | Discharge: 2014-02-18 | DRG: 432 | Disposition: A | Discharge status: Home / Self Care | Payer: BC Managed Care – PPO | Attending: Internal Medicine | Admitting: Internal Medicine

## 2014-02-15 ENCOUNTER — Encounter (HOSPITAL_COMMUNITY): Payer: Self-pay | Admitting: *Deleted

## 2014-02-15 ENCOUNTER — Emergency Department (HOSPITAL_COMMUNITY): Payer: BC Managed Care – PPO

## 2014-02-15 DIAGNOSIS — K769 Liver disease, unspecified: Secondary | ICD-10-CM

## 2014-02-15 DIAGNOSIS — I8500 Esophageal varices without bleeding: Secondary | ICD-10-CM | POA: Diagnosis present

## 2014-02-15 DIAGNOSIS — K7460 Unspecified cirrhosis of liver: Secondary | ICD-10-CM | POA: Diagnosis not present

## 2014-02-15 DIAGNOSIS — D61818 Other pancytopenia: Secondary | ICD-10-CM | POA: Diagnosis present

## 2014-02-15 DIAGNOSIS — N179 Acute kidney failure, unspecified: Secondary | ICD-10-CM | POA: Diagnosis present

## 2014-02-15 DIAGNOSIS — R140 Abdominal distension (gaseous): Secondary | ICD-10-CM | POA: Diagnosis present

## 2014-02-15 DIAGNOSIS — R0602 Shortness of breath: Secondary | ICD-10-CM

## 2014-02-15 DIAGNOSIS — N183 Chronic kidney disease, stage 3 (moderate): Secondary | ICD-10-CM | POA: Diagnosis present

## 2014-02-15 DIAGNOSIS — R0902 Hypoxemia: Secondary | ICD-10-CM

## 2014-02-15 DIAGNOSIS — I10 Essential (primary) hypertension: Secondary | ICD-10-CM | POA: Diagnosis present

## 2014-02-15 DIAGNOSIS — G4733 Obstructive sleep apnea (adult) (pediatric): Secondary | ICD-10-CM | POA: Diagnosis present

## 2014-02-15 DIAGNOSIS — N189 Chronic kidney disease, unspecified: Secondary | ICD-10-CM | POA: Diagnosis present

## 2014-02-15 DIAGNOSIS — F329 Major depressive disorder, single episode, unspecified: Secondary | ICD-10-CM | POA: Diagnosis present

## 2014-02-15 DIAGNOSIS — E8770 Fluid overload, unspecified: Secondary | ICD-10-CM | POA: Diagnosis present

## 2014-02-15 DIAGNOSIS — E119 Type 2 diabetes mellitus without complications: Secondary | ICD-10-CM

## 2014-02-15 DIAGNOSIS — Z888 Allergy status to other drugs, medicaments and biological substances status: Secondary | ICD-10-CM

## 2014-02-15 DIAGNOSIS — R5381 Other malaise: Secondary | ICD-10-CM

## 2014-02-15 DIAGNOSIS — J9601 Acute respiratory failure with hypoxia: Secondary | ICD-10-CM | POA: Diagnosis present

## 2014-02-15 DIAGNOSIS — K7581 Nonalcoholic steatohepatitis (NASH): Secondary | ICD-10-CM | POA: Diagnosis present

## 2014-02-15 DIAGNOSIS — K219 Gastro-esophageal reflux disease without esophagitis: Secondary | ICD-10-CM | POA: Diagnosis present

## 2014-02-15 DIAGNOSIS — F419 Anxiety disorder, unspecified: Secondary | ICD-10-CM | POA: Diagnosis present

## 2014-02-15 DIAGNOSIS — D696 Thrombocytopenia, unspecified: Secondary | ICD-10-CM

## 2014-02-15 DIAGNOSIS — D649 Anemia, unspecified: Secondary | ICD-10-CM | POA: Diagnosis present

## 2014-02-15 DIAGNOSIS — E7220 Disorder of urea cycle metabolism, unspecified: Secondary | ICD-10-CM

## 2014-02-15 DIAGNOSIS — Z794 Long term (current) use of insulin: Secondary | ICD-10-CM

## 2014-02-15 DIAGNOSIS — E8809 Other disorders of plasma-protein metabolism, not elsewhere classified: Secondary | ICD-10-CM

## 2014-02-15 DIAGNOSIS — I129 Hypertensive chronic kidney disease with stage 1 through stage 4 chronic kidney disease, or unspecified chronic kidney disease: Secondary | ICD-10-CM | POA: Diagnosis present

## 2014-02-15 LAB — URINALYSIS, ROUTINE W REFLEX MICROSCOPIC
Bilirubin Urine: NEGATIVE
Glucose, UA: NEGATIVE mg/dL
Hgb urine dipstick: NEGATIVE
Ketones, ur: NEGATIVE mg/dL
LEUKOCYTES UA: NEGATIVE
NITRITE: NEGATIVE
PH: 5 (ref 5.0–8.0)
Protein, ur: NEGATIVE mg/dL
Specific Gravity, Urine: 1.019 (ref 1.005–1.030)
UROBILINOGEN UA: 1 mg/dL (ref 0.0–1.0)

## 2014-02-15 LAB — COMPREHENSIVE METABOLIC PANEL
ALBUMIN: 2 g/dL — AB (ref 3.5–5.2)
ALK PHOS: 114 U/L (ref 39–117)
ALT: 23 U/L (ref 0–53)
AST: 74 U/L — AB (ref 0–37)
Anion gap: 6 (ref 5–15)
BUN: 15 mg/dL (ref 6–23)
CALCIUM: 8.4 mg/dL (ref 8.4–10.5)
CO2: 32 mmol/L (ref 19–32)
CREATININE: 1.42 mg/dL — AB (ref 0.50–1.35)
Chloride: 103 mmol/L (ref 96–112)
GFR calc Af Amer: 54 mL/min — ABNORMAL LOW (ref >90)
GFR, EST NON AFRICAN AMERICAN: 46 mL/min — AB (ref >90)
GLUCOSE: 174 mg/dL — AB (ref 70–99)
POTASSIUM: 4 mmol/L (ref 3.5–5.1)
SODIUM: 141 mmol/L (ref 135–145)
TOTAL PROTEIN: 6.5 g/dL (ref 6.0–8.3)
Total Bilirubin: 2.6 mg/dL — ABNORMAL HIGH (ref 0.3–1.2)

## 2014-02-15 LAB — CBC
HEMATOCRIT: 35.4 % — AB (ref 39.0–52.0)
Hemoglobin: 11.9 g/dL — ABNORMAL LOW (ref 13.0–17.0)
MCH: 33.3 pg (ref 26.0–34.0)
MCHC: 33.6 g/dL (ref 30.0–36.0)
MCV: 99.2 fL (ref 78.0–100.0)
Platelets: 81 10*3/uL — ABNORMAL LOW (ref 150–400)
RBC: 3.57 MIL/uL — ABNORMAL LOW (ref 4.22–5.81)
RDW: 16 % — ABNORMAL HIGH (ref 11.5–15.5)
WBC: 4.2 10*3/uL (ref 4.0–10.5)

## 2014-02-15 LAB — PROTIME-INR
INR: 1.6 — ABNORMAL HIGH (ref 0.00–1.49)
Prothrombin Time: 19.2 seconds — ABNORMAL HIGH (ref 11.6–15.2)

## 2014-02-15 LAB — AMMONIA: AMMONIA: 63 umol/L — AB (ref 11–32)

## 2014-02-15 LAB — BRAIN NATRIURETIC PEPTIDE: B NATRIURETIC PEPTIDE 5: 202.5 pg/mL — AB (ref 0.0–100.0)

## 2014-02-15 LAB — TROPONIN I: Troponin I: 0.03 ng/mL (ref <0.031)

## 2014-02-15 LAB — CBG MONITORING, ED: Glucose-Capillary: 117 mg/dL — ABNORMAL HIGH (ref 70–99)

## 2014-02-15 LAB — GLUCOSE, CAPILLARY: Glucose-Capillary: 175 mg/dL — ABNORMAL HIGH (ref 70–99)

## 2014-02-15 MED ORDER — INSULIN ASPART 100 UNIT/ML ~~LOC~~ SOLN: 0.0000 [IU] | SUBCUTANEOUS | Status: DC

## 2014-02-15 MED ORDER — ONDANSETRON HCL 4 MG PO TABS: 4.0000 mg | ORAL_TABLET | Freq: Four times a day (QID) | ORAL | Status: DC | PRN

## 2014-02-15 MED ORDER — INSULIN ASPART 100 UNIT/ML ~~LOC~~ SOLN: 0.0000 [IU] | Freq: Every day | SUBCUTANEOUS | Status: DC

## 2014-02-15 MED ORDER — FUROSEMIDE 10 MG/ML IJ SOLN
40.0000 mg | Freq: Two times a day (BID) | INTRAMUSCULAR | Status: DC
  Administered 2014-02-16: 40 mg via INTRAVENOUS
  Filled 2014-02-15 (×3): qty 4

## 2014-02-15 MED ORDER — LACTULOSE 10 GM/15ML PO SOLN
20.0000 g | Freq: Once | ORAL | Status: AC
  Administered 2014-02-15: 20 g via ORAL
  Filled 2014-02-15 (×2): qty 30

## 2014-02-15 MED ORDER — PROPRANOLOL HCL 10 MG PO TABS
10.0000 mg | ORAL_TABLET | Freq: Two times a day (BID) | ORAL | Status: DC
  Administered 2014-02-15 – 2014-02-18 (×6): 10 mg via ORAL
  Filled 2014-02-15 (×7): qty 1

## 2014-02-15 MED ORDER — HYDROCODONE-ACETAMINOPHEN 5-325 MG PO TABS: 1.0000 | ORAL_TABLET | ORAL | Status: DC | PRN

## 2014-02-15 MED ORDER — ACETAMINOPHEN 650 MG RE SUPP: 650.0000 mg | Freq: Four times a day (QID) | RECTAL | Status: DC | PRN

## 2014-02-15 MED ORDER — RIFAXIMIN 550 MG PO TABS
550.0000 mg | ORAL_TABLET | Freq: Two times a day (BID) | ORAL | Status: DC
  Administered 2014-02-15 – 2014-02-18 (×6): 550 mg via ORAL
  Filled 2014-02-15 (×7): qty 1

## 2014-02-15 MED ORDER — SODIUM CHLORIDE 0.9 % IV SOLN
INTRAVENOUS | Status: DC
  Administered 2014-02-15: 16:00:00 via INTRAVENOUS

## 2014-02-15 MED ORDER — LACTULOSE 10 GM/15ML PO SOLN
10.0000 g | Freq: Three times a day (TID) | ORAL | Status: DC
  Administered 2014-02-15 – 2014-02-17 (×7): 10 g via ORAL
  Filled 2014-02-15 (×10): qty 15

## 2014-02-15 MED ORDER — PANTOPRAZOLE SODIUM 20 MG PO TBEC
20.0000 mg | DELAYED_RELEASE_TABLET | Freq: Every day | ORAL | Status: DC
  Administered 2014-02-15 – 2014-02-17 (×3): 20 mg via ORAL
  Filled 2014-02-15 (×4): qty 1

## 2014-02-15 MED ORDER — GUAIFENESIN ER 600 MG PO TB12
600.0000 mg | ORAL_TABLET | Freq: Two times a day (BID) | ORAL | Status: DC
  Administered 2014-02-15 – 2014-02-18 (×6): 600 mg via ORAL
  Filled 2014-02-15 (×7): qty 1

## 2014-02-15 MED ORDER — SODIUM CHLORIDE 0.9 % IJ SOLN
3.0000 mL | Freq: Two times a day (BID) | INTRAMUSCULAR | Status: DC
  Administered 2014-02-16 – 2014-02-18 (×4): 3 mL via INTRAVENOUS

## 2014-02-15 MED ORDER — ACETAMINOPHEN 325 MG PO TABS
650.0000 mg | ORAL_TABLET | Freq: Four times a day (QID) | ORAL | Status: DC | PRN
  Administered 2014-02-18: 650 mg via ORAL
  Filled 2014-02-15: qty 2

## 2014-02-15 MED ORDER — SODIUM CHLORIDE 0.9 % IV SOLN: 250.0000 mL | INTRAVENOUS | Status: DC | PRN

## 2014-02-15 MED ORDER — INSULIN ASPART PROT & ASPART (70-30 MIX) 100 UNIT/ML ~~LOC~~ SUSP
30.0000 [IU] | Freq: Two times a day (BID) | SUBCUTANEOUS | Status: DC
  Filled 2014-02-15: qty 10

## 2014-02-15 MED ORDER — DARIFENACIN HYDROBROMIDE ER 15 MG PO TB24
15.0000 mg | ORAL_TABLET | Freq: Every day | ORAL | Status: DC
  Administered 2014-02-16 – 2014-02-18 (×3): 15 mg via ORAL
  Filled 2014-02-15 (×3): qty 1

## 2014-02-15 MED ORDER — ONDANSETRON HCL 4 MG/2ML IJ SOLN: 4.0000 mg | Freq: Four times a day (QID) | INTRAMUSCULAR | Status: DC | PRN

## 2014-02-15 MED ORDER — FUROSEMIDE 40 MG PO TABS
40.0000 mg | ORAL_TABLET | Freq: Two times a day (BID) | ORAL | Status: DC
Start: 2014-02-16 — End: 2014-02-15
  Filled 2014-02-15: qty 1

## 2014-02-15 MED ORDER — INSULIN ASPART 100 UNIT/ML ~~LOC~~ SOLN: 0.0000 [IU] | Freq: Three times a day (TID) | SUBCUTANEOUS | Status: DC

## 2014-02-15 MED ORDER — AMILORIDE HCL 5 MG PO TABS
5.0000 mg | ORAL_TABLET | Freq: Every day | ORAL | Status: DC
  Administered 2014-02-16 – 2014-02-18 (×3): 5 mg via ORAL
  Filled 2014-02-15 (×3): qty 1

## 2014-02-15 MED ORDER — SODIUM CHLORIDE 0.9 % IJ SOLN: 3.0000 mL | INTRAMUSCULAR | Status: DC | PRN

## 2014-02-15 MED ORDER — VENLAFAXINE HCL 75 MG PO TABS
75.0000 mg | ORAL_TABLET | Freq: Two times a day (BID) | ORAL | Status: DC
  Administered 2014-02-15 – 2014-02-18 (×6): 75 mg via ORAL
  Filled 2014-02-15 (×7): qty 1

## 2014-02-15 MED ORDER — FERROUS SULFATE 325 (65 FE) MG PO TABS
325.0000 mg | ORAL_TABLET | Freq: Every day | ORAL | Status: DC
  Administered 2014-02-16 – 2014-02-17 (×2): 325 mg via ORAL
  Filled 2014-02-15 (×3): qty 1

## 2014-02-15 MED ORDER — ROSUVASTATIN CALCIUM 10 MG PO TABS
10.0000 mg | ORAL_TABLET | Freq: Every evening | ORAL | Status: DC
  Administered 2014-02-16 – 2014-02-17 (×2): 10 mg via ORAL
  Filled 2014-02-15 (×3): qty 1

## 2014-02-15 MED ORDER — FUROSEMIDE 10 MG/ML IJ SOLN
40.0000 mg | Freq: Once | INTRAMUSCULAR | Status: AC
  Administered 2014-02-15: 40 mg via INTRAVENOUS
  Filled 2014-02-15: qty 4

## 2014-02-15 NOTE — ED Notes (Signed)
Pt was at regular MD and became SOB with sats 65%.  EMS stated his sats were 99% on 2 liters o2.  Pt c/o edema bilateral legs and distended abdomin.  Hx stage 4 liver disease, stage 3 kidney disease, diabetes.

## 2014-02-15 NOTE — ED Provider Notes (Signed)
CSN: [id number]     Arrival date & time 02/15/14  1531 History   First MD Initiated Contact with Patient 02/15/14 1557     Chief Complaint  Patient presents with  . Shortness of Breath  . Leg Swelling     (Consider location/radiation/quality/duration/timing/severity/associated sxs/prior Treatment) Patient is a 79 y.o. male presenting with shortness of breath. The history is provided by the patient. The history is limited by the condition of the patient.  Shortness of Breath Associated symptoms: no abdominal pain, no chest pain, no cough, no fever, no headaches, no neck pain, no rash, no sore throat and no vomiting   pt with hx crf, and advanced liver disease, c/o increased bilateral leg swelling, sob, gen weakness, and confusion in the past week. Moderate. Constant. Worsening. No specific exacerbating or alleviating factors. States is compliant w normal meds. States is on lactulose as well as 'sodium benzoate' - pt unclear on number of bms per day. Pt w confusion - very difficult historian - level 5 caveat. Denies pain. No headaches. No cp. No abd pain. +sob. Moderate, worse w exertion. No fever or chills. Was at pcp office today. Wt was noted 10+ lbs above baseline, and pt was noted to have low pulse ox, so pcp sent to ED for admit.       Past Medical History  Diagnosis Date  . NASH (nonalcoholic steatohepatitis)   . Diabetes mellitus without complication   . Kidney disease   . Hypertension   . GERD (gastroesophageal reflux disease)     esophageal varices  . Depression   . Anxiety   . Sleep apnea     use CPAP-14  . Anemia   . Esophageal varices    Past Surgical History  Procedure Laterality Date  . Tonsillectomy    . Left arm surgery  1984    crush injury-pins  . Esophagogastroduodenoscopy (egd) with propofol N/A 11/18/2012    Procedure: ESOPHAGOGASTRODUODENOSCOPY (EGD) WITH PROPOFOL;  Surgeon: Cleotis Nipper, MD;  Location: [facility] ENDOSCOPY;  Service: Endoscopy;  Laterality:  N/A;  . Colonoscopy with propofol N/A 11/18/2012    Procedure: COLONOSCOPY WITH PROPOFOL;  Surgeon: Cleotis Nipper, MD;  Location: [facility] ENDOSCOPY;  Service: Endoscopy;  Laterality: N/A;   No family history on file. History  Substance Use Topics  . Smoking status: Never Smoker   . Smokeless tobacco: Not on file  . Alcohol Use: Yes     Comment: occ    Review of Systems  Constitutional: Negative for fever and chills.  HENT: Negative for sore throat.   Eyes: Negative for visual disturbance.  Respiratory: Positive for shortness of breath. Negative for cough.   Cardiovascular: Negative for chest pain.  Gastrointestinal: Negative for vomiting, abdominal pain and diarrhea.  Endocrine: Negative for polyuria.  Genitourinary: Negative for dysuria and flank pain.  Musculoskeletal: Negative for back pain and neck pain.  Skin: Negative for rash.  Neurological: Negative for syncope, numbness and headaches.  Hematological: Does not bruise/bleed easily.  Psychiatric/Behavioral: Positive for confusion.      Allergies  Aldactone  Home Medications   Prior to Admission medications   Medication Sig Start Date End Date Taking? Authorizing Provider  aMILoride (MIDAMOR) 5 MG tablet Take 5 mg by mouth daily.    Historical Provider, MD  aspirin EC 81 MG tablet Take 81 mg by mouth 2 (two) times daily.    Historical Provider, MD  cholecalciferol (VITAMIN D) 1000 UNITS tablet Take 1,000 Units by mouth every morning.  Historical Provider, MD  clotrimazole-betamethasone (LOTRISONE) cream Apply 1 application topically 2 (two) times daily.    Historical Provider, MD  ferrous sulfate 325 (65 FE) MG tablet Take 325 mg by mouth at bedtime.    Historical Provider, MD  furosemide (LASIX) 40 MG tablet Take 40 mg by mouth 2 (two) times daily.    Historical Provider, MD  Insulin Detemir (LEVEMIR FLEXPEN) 100 UNIT/ML SOPN Inject 60 Units into the skin 2 (two) times daily. Sliding scale as directed    Historical  Provider, MD  lactulose (CHRONULAC) 10 GM/15ML solution Take 10 g by mouth 3 (three) times daily.    Historical Provider, MD  losartan (COZAAR) 100 MG tablet Take 50 mg by mouth every evening.     Historical Provider, MD  Multiple Vitamin (MULTIVITAMIN WITH MINERALS) TABS tablet Take 1 tablet by mouth daily.    Historical Provider, MD  omeprazole (PRILOSEC) 20 MG capsule Take 20 mg by mouth daily.    Historical Provider, MD  PRESCRIPTION MEDICATION Apply 1 application topically daily as needed. Eucerin/Triamcinolone 0.1% apply for dry skin    Historical Provider, MD  Probiotic Product (PROBIOTIC DAILY) CAPS Take 2 capsules by mouth every morning.    Historical Provider, MD  propranolol (INDERAL) 10 MG tablet Take 10 mg by mouth 2 (two) times daily.    Historical Provider, MD  rifaximin (XIFAXAN) 550 MG TABS tablet Take 550 mg by mouth 2 (two) times daily.    Historical Provider, MD  rosuvastatin (CRESTOR) 20 MG tablet Take 20 mg by mouth every evening.     Historical Provider, MD  solifenacin (VESICARE) 10 MG tablet Take 10 mg by mouth daily.    Historical Provider, MD  venlafaxine (EFFEXOR) 75 MG tablet Take 75 mg by mouth 2 (two) times daily.    Historical Provider, MD   BP 138/60 mmHg  Pulse 62  Temp(Src) 98.6 F (37 C) (Oral)  Resp 12  Ht 5\' 8"  (1.727 m)  Wt 223 lb (101.152 kg)  BMI 33.91 kg/m2  SpO2 99% Physical Exam  Constitutional: He appears well-developed and well-nourished. No distress.  HENT:  Head: Atraumatic.  Mouth/Throat: Oropharynx is clear and moist.  Eyes: Conjunctivae are normal. Pupils are equal, round, and reactive to light. No scleral icterus.  Neck: Neck supple. No tracheal deviation present.  Cardiovascular: Normal rate, regular rhythm, normal heart sounds and intact distal pulses.   Pulmonary/Chest: Effort normal. No accessory muscle usage. No respiratory distress. He has rales.  Abdominal: Soft. Bowel sounds are normal. He exhibits no distension and no mass.  There is no tenderness. There is no rebound and no guarding.  Genitourinary:  No cva tenderness  Musculoskeletal: Normal range of motion. He exhibits edema. He exhibits no tenderness.  2-3+ bilateral lower ext and foot swelling, symmetric.   Neurological: He is alert.  Awake. Speech fluent. Confused, c/w recent baseline per family. Moves bil ext purposefully w good strength, follows commands.   Skin: Skin is warm and dry.  Psychiatric:  Alert, mildly confused.   Nursing note and vitals reviewed.   ED Course  Procedures (including critical care time) Labs Review  Results for orders placed or performed during the hospital encounter of 02/15/14  CBC  Result Value Ref Range   WBC 4.2 4.0 - 10.5 K/uL   RBC 3.57 (L) 4.22 - 5.81 MIL/uL   Hemoglobin 11.9 (L) 13.0 - 17.0 g/dL   HCT 35.4 (L) 39.0 - 52.0 %   MCV 99.2 78.0 - 100.0  fL   MCH 33.3 26.0 - 34.0 pg   MCHC 33.6 30.0 - 36.0 g/dL   RDW 16.0 (H) 11.5 - 15.5 %   Platelets 81 (L) 150 - 400 K/uL  Comprehensive metabolic panel  Result Value Ref Range   Sodium 141 135 - 145 mmol/L   Potassium 4.0 3.5 - 5.1 mmol/L   Chloride 103 96 - 112 mmol/L   CO2 32 19 - 32 mmol/L   Glucose, Bld 174 (H) 70 - 99 mg/dL   BUN 15 6 - 23 mg/dL   Creatinine, Ser 1.42 (H) 0.50 - 1.35 mg/dL   Calcium 8.4 8.4 - 10.5 mg/dL   Total Protein 6.5 6.0 - 8.3 g/dL   Albumin 2.0 (L) 3.5 - 5.2 g/dL   AST 74 (H) 0 - 37 U/L   ALT 23 0 - 53 U/L   Alkaline Phosphatase 114 39 - 117 U/L   Total Bilirubin 2.6 (H) 0.3 - 1.2 mg/dL   GFR calc non Af Amer 46 (L) >90 mL/min   GFR calc Af Amer 54 (L) >90 mL/min   Anion gap 6 5 - 15  Urinalysis, Routine w reflex microscopic  Result Value Ref Range   Color, Urine YELLOW YELLOW   APPearance CLEAR CLEAR   Specific Gravity, Urine 1.019 1.005 - 1.030   pH 5.0 5.0 - 8.0   Glucose, UA NEGATIVE NEGATIVE mg/dL   Hgb urine dipstick NEGATIVE NEGATIVE   Bilirubin Urine NEGATIVE NEGATIVE   Ketones, ur NEGATIVE NEGATIVE mg/dL    Protein, ur NEGATIVE NEGATIVE mg/dL   Urobilinogen, UA 1.0 0.0 - 1.0 mg/dL   Nitrite NEGATIVE NEGATIVE   Leukocytes, UA NEGATIVE NEGATIVE  Protime-INR  Result Value Ref Range   Prothrombin Time 19.2 (H) 11.6 - 15.2 seconds   INR 1.60 (H) 0.00 - 1.49  Ammonia  Result Value Ref Range   Ammonia 63 (H) 11 - 32 umol/L  Brain natriuretic peptide  Result Value Ref Range   B Natriuretic Peptide 202.5 (H) 0.0 - 100.0 pg/mL  Troponin I  Result Value Ref Range   Troponin I 0.03 <0.031 ng/mL   Dg Chest 2 View  02/15/2014   CLINICAL DATA:  Two weeks of shortness breath weakness and fatigue semi: History of diabetes and renal insufficiency  EXAM: CHEST  2 VIEW  COMPARISON:  Portable chest x-ray of September 21, 2012  FINDINGS: The lungs are mildly hypoinflated. There is mild linear density at the right lung base. The cardiac silhouette is enlarged. The pulmonary vascularity is mildly prominent centrally but some vascular crowding is occurring. There is no pleural effusion. The bony thorax exhibits no acute abnormality.  IMPRESSION: Bilateral pulmonary hypoinflation. There is right basilar atelectasis or early pneumonia. A follow-up chest x-ray with straight AP positioning or better yet a PA and lateral study would be useful.   Electronically Signed   By: David  Martinique   On: 02/15/2014 17:32        EKG Interpretation   Date/Time:  Wednesday February 15 2014 15:36:59 EST Ventricular Rate:  62 PR Interval:  177 QRS Duration: 105 QT Interval:  455 QTC Calculation: 462 R Axis:   7 Text Interpretation:  Sinus rhythm No significant change since last  tracing Confirmed by Caroll Weinheimer  MD, Lennette Bihari ([id number]) on 02/15/2014 4:08:18 PM      MDM   Iv ns. Continuous pulse ox and monitor. o2 [place]. Labs. Cxr.   Reviewed nursing notes and prior charts for additional history.   Ammonia  level elevated. Wt inc, basilar rale, inc lower extremity edema.   Lasix iv.   Lactulose pcp.   Pt/fam request  admit.  Hospitalist paged.   Of note, reported pulse ox from pcp office of 65% does not appear accurate, on room air here in 90's.        Mirna Mires, MD 02/15/14 [phone]

## 2014-02-15 NOTE — H&P (Signed)
PCP:  Shirline Frees, MD  [facility]  Chief Complaint:  debility  HPI: [name] is a 79 y.o. male   has a past medical history of NASH (nonalcoholic steatohepatitis); Diabetes mellitus without complication; Kidney disease; Hypertension; GERD (gastroesophageal reflux disease); Depression; Anxiety; Sleep apnea; Anemia; and Esophageal varices.   Presented with  Patient with diagnosis of NASH followed by [facility] with hx of Hepatic encephalopy, renal insuffiencey and varices. He has been seen at [facility] by Dr. Orvan July. Patient is here after he was found to be hypoxic during his routine PCP visit down to 60s. Arrival the lowest oxygenation was found to be 92% on room air.  He have had worsening lower extremity edema. He have had some fluid build up he is up 14 lb. Albumin down to 2.0 INR up 1.6. Creatinine has been increasing no 1.42.  Wife states that he has been much weaker and has had trouble ambulating. Denies confusion. Patient reports taking rock shows an having about 5 bowel movements per day. Denies any blood per rectum.  No fever or chills.  Hospitalist was called for admission for hypoxia worsening renal failure, fluid overload.   Review of Systems:    Pertinent positives include: mild cough  Constitutional:  No weight loss, night sweats, Fevers, chills, fatigue, weight loss  HEENT:  No headaches, Difficulty swallowing,Tooth/dental problems,Sore throat,  No sneezing, itching, ear ache, nasal congestion, post nasal drip,  Cardio-vascular:  No chest pain, Orthopnea, PND, anasarca, dizziness, palpitations.no Bilateral lower extremity swelling  GI:  No heartburn, indigestion, abdominal pain, nausea, vomiting, diarrhea, change in bowel habits, loss of appetite, melena, blood in stool, hematemesis Resp:  no shortness of breath at rest. No dyspnea on exertion, No excess mucus, no productive cough, No non-productive cough, No coughing up of blood.No change in color of  mucus.No wheezing. Skin:  no rash or lesions. No jaundice GU:  no dysuria, change in color of urine, no urgency or frequency. No straining to urinate.  No flank pain.  Musculoskeletal:  No joint pain or no joint swelling. No decreased range of motion. No back pain.  Psych:  No change in mood or affect. No depression or anxiety. No memory loss.  Neuro: no localizing neurological complaints, no tingling, no weakness, no double vision, no gait abnormality, no slurred speech, no confusion  Otherwise ROS are negative except for above, 10 systems were reviewed  Past Medical History: Past Medical History  Diagnosis Date  . NASH (nonalcoholic steatohepatitis)   . Diabetes mellitus without complication   . Kidney disease   . Hypertension   . GERD (gastroesophageal reflux disease)     esophageal varices  . Depression   . Anxiety   . Sleep apnea     use CPAP-14  . Anemia   . Esophageal varices    Past Surgical History  Procedure Laterality Date  . Tonsillectomy    . Left arm surgery  1984    crush injury-pins  . Esophagogastroduodenoscopy (egd) with propofol N/A 11/18/2012    Procedure: ESOPHAGOGASTRODUODENOSCOPY (EGD) WITH PROPOFOL;  Surgeon: Cleotis Nipper, MD;  Location: [facility];  Service: Endoscopy;  Laterality: N/A;  . Colonoscopy with propofol N/A 11/18/2012    Procedure: COLONOSCOPY WITH PROPOFOL;  Surgeon: Cleotis Nipper, MD;  Location: [facility];  Service: Endoscopy;  Laterality: N/A;     Medications: Prior to Admission medications   Medication Sig Start Date End Date Taking? Authorizing Provider  insulin aspart protamine- aspart (NOVOLOG  MIX 70/30) (70-30) 100 UNIT/ML injection Inject 30 Units into the skin 2 (two) times daily with a meal.   Yes Historical Provider, MD  aMILoride (MIDAMOR) 5 MG tablet Take 5 mg by mouth daily.    Historical Provider, MD  cholecalciferol (VITAMIN D) 1000 UNITS tablet Take 1,000 Units by mouth every morning.    Historical  Provider, MD  clotrimazole-betamethasone (LOTRISONE) cream Apply 1 application topically 2 (two) times daily.    Historical Provider, MD  ferrous sulfate 325 (65 FE) MG tablet Take 325 mg by mouth at bedtime.    Historical Provider, MD  furosemide (LASIX) 40 MG tablet Take 40 mg by mouth 2 (two) times daily.    Historical Provider, MD  lactulose (CHRONULAC) 10 GM/15ML solution Take 10 g by mouth 3 (three) times daily.    Historical Provider, MD  Multiple Vitamin (MULTIVITAMIN WITH MINERALS) TABS tablet Take 1 tablet by mouth daily.    Historical Provider, MD  omeprazole (PRILOSEC) 20 MG capsule Take 20 mg by mouth daily.    Historical Provider, MD  PRESCRIPTION MEDICATION Apply 1 application topically daily as needed. Eucerin/Triamcinolone 0.1% apply for dry skin    Historical Provider, MD  Probiotic Product (PROBIOTIC DAILY) CAPS Take 2 capsules by mouth every morning.    Historical Provider, MD  propranolol (INDERAL) 10 MG tablet Take 10 mg by mouth 2 (two) times daily.    Historical Provider, MD  rifaximin (XIFAXAN) 550 MG TABS tablet Take 550 mg by mouth 2 (two) times daily.    Historical Provider, MD  rosuvastatin (CRESTOR) 20 MG tablet Take 10 mg by mouth every evening.     Historical Provider, MD  solifenacin (VESICARE) 10 MG tablet Take 10 mg by mouth daily.    Historical Provider, MD  venlafaxine (EFFEXOR) 75 MG tablet Take 75 mg by mouth 2 (two) times daily.    Historical Provider, MD    Allergies:   Allergies  Allergen Reactions  . Aldactone [Spironolactone] Swelling    Breast swell, painful    Social History:  Ambulatory   independently   Lives at home  With family     reports that he has never smoked. He does not have any smokeless tobacco history on file. He reports that he drinks alcohol. He reports that he does not use illicit drugs.    Family History: family history includes Alcoholism in his father; Diabetes type II in his mother and sister.    Physical  Exam: Patient Vitals for the past 24 hrs:  BP Temp Temp src Pulse Resp SpO2 Height Weight  02/15/14 1930 (!) 131/103 mmHg - - 71 23 98 % - -  02/15/14 1915 109/73 mmHg - - 72 22 92 % - -  02/15/14 1900 126/59 mmHg - - 65 19 96 % - -  02/15/14 1845 126/60 mmHg - - 65 17 97 % - -  02/15/14 1830 127/56 mmHg - - 66 19 97 % - -  02/15/14 1815 123/56 mmHg - - 65 17 98 % - -  02/15/14 1800 107/63 mmHg - - 63 19 100 % - -  02/15/14 1745 101/74 mmHg - - 64 23 98 % - -  02/15/14 1700 (!) 112/49 mmHg - - 65 19 99 % - -  02/15/14 1645 118/58 mmHg - - 65 22 99 % - -  02/15/14 1615 130/61 mmHg - - 64 20 100 % - -  02/15/14 1600 (!) 129/50 mmHg - - 62 21 97 % - -  02/15/14 1545 (!) 130/52 mmHg - - 62 16 98 % - -  02/15/14 1542 138/60 mmHg 98.6 F (37 C) Oral 62 12 99 % 5\' 8"  (1.727 m) 101.152 kg (223 lb)  02/15/14 1540 - - - 67 22 94 % - -    1. General:  in No Acute distress 2. Psychological: Alert and  Oriented 3. Head/ENT:   Moist  Mucous Membranes                          Head Non traumatic, neck supple                            Poor Dentition 4. SKIN: normal   turgor,  Skin clean Dry and intact no rash 5. Heart: Regular rate and rhythm no Murmur, Rub or gallop 6. Lungs:  no wheezes or crackles   7. Abdomen: Soft, non-tender,   distended 8. Lower extremities: no clubbing, cyanosis, 3+ edema 9. Neurologically Grossly intact, moving all 4 extremities equally no asterixis 10. MSK: Normal range of motion  body mass index is 33.91 kg/(m^2).   Labs on Admission:   Results for orders placed or performed during the hospital encounter of 02/15/14 (from the past 24 hour(s))  CBC     Status: Abnormal   Collection Time: 02/15/14  4:07 PM  Result Value Ref Range   WBC 4.2 4.0 - 10.5 K/uL   RBC 3.57 (L) 4.22 - 5.81 MIL/uL   Hemoglobin 11.9 (L) 13.0 - 17.0 g/dL   HCT 35.4 (L) 39.0 - 52.0 %   MCV 99.2 78.0 - 100.0 fL   MCH 33.3 26.0 - 34.0 pg   MCHC 33.6 30.0 - 36.0 g/dL   RDW 16.0 (H) 11.5 -  15.5 %   Platelets 81 (L) 150 - 400 K/uL  Comprehensive metabolic panel     Status: Abnormal   Collection Time: 02/15/14  4:07 PM  Result Value Ref Range   Sodium 141 135 - 145 mmol/L   Potassium 4.0 3.5 - 5.1 mmol/L   Chloride 103 96 - 112 mmol/L   CO2 32 19 - 32 mmol/L   Glucose, Bld 174 (H) 70 - 99 mg/dL   BUN 15 6 - 23 mg/dL   Creatinine, Ser 1.42 (H) 0.50 - 1.35 mg/dL   Calcium 8.4 8.4 - 10.5 mg/dL   Total Protein 6.5 6.0 - 8.3 g/dL   Albumin 2.0 (L) 3.5 - 5.2 g/dL   AST 74 (H) 0 - 37 U/L   ALT 23 0 - 53 U/L   Alkaline Phosphatase 114 39 - 117 U/L   Total Bilirubin 2.6 (H) 0.3 - 1.2 mg/dL   GFR calc non Af Amer 46 (L) >90 mL/min   GFR calc Af Amer 54 (L) >90 mL/min   Anion gap 6 5 - 15  Protime-INR     Status: Abnormal   Collection Time: 02/15/14  4:07 PM  Result Value Ref Range   Prothrombin Time 19.2 (H) 11.6 - 15.2 seconds   INR 1.60 (H) 0.00 - 1.49  Ammonia     Status: Abnormal   Collection Time: 02/15/14  4:07 PM  Result Value Ref Range   Ammonia 63 (H) 11 - 32 umol/L  Troponin I     Status: None   Collection Time: 02/15/14  4:07 PM  Result Value Ref Range   Troponin I 0.03 <0.031 ng/mL  Brain  natriuretic peptide     Status: Abnormal   Collection Time: 02/15/14  4:14 PM  Result Value Ref Range   B Natriuretic Peptide 202.5 (H) 0.0 - 100.0 pg/mL  Urinalysis, Routine w reflex microscopic     Status: None   Collection Time: 02/15/14  4:32 PM  Result Value Ref Range   Color, Urine YELLOW YELLOW   APPearance CLEAR CLEAR   Specific Gravity, Urine 1.019 1.005 - 1.030   pH 5.0 5.0 - 8.0   Glucose, UA NEGATIVE NEGATIVE mg/dL   Hgb urine dipstick NEGATIVE NEGATIVE   Bilirubin Urine NEGATIVE NEGATIVE   Ketones, ur NEGATIVE NEGATIVE mg/dL   Protein, ur NEGATIVE NEGATIVE mg/dL   Urobilinogen, UA 1.0 0.0 - 1.0 mg/dL   Nitrite NEGATIVE NEGATIVE   Leukocytes, UA NEGATIVE NEGATIVE  CBG monitoring, ED     Status: Abnormal   Collection Time: 02/15/14  6:37 PM  Result  Value Ref Range   Glucose-Capillary 117 (H) 70 - 99 mg/dL    UA no evidence of UA  Lab Results  Component Value Date   HGBA1C * 04/02/2007    8.2 (NOTE)   The ADA recommends the following therapeutic goals for glycemic   control related to Hgb A1C measurement:   Goal of Therapy:   < 7.0% Hgb A1C   Action Suggested:  > 8.0% Hgb A1C   Ref:  Diabetes Care, 22, Suppl. 1, 1999    Estimated Creatinine Clearance: 51 mL/min (by C-G formula based on Cr of 1.42).  BNP (last 3 results) No results for input(s): PROBNP in the last 8760 hours.  Other results:  I have pearsonaly reviewed this: ECG REPORT  Rate: 62  Rhythm: NSR ST&T Change: no ischemic changes   Filed Weights   02/15/14 1542  Weight: 101.152 kg (223 lb)     Cultures: No results found for: SDES, SPECREQUEST, CULT, REPTSTATUS   Radiological Exams on Admission: Dg Chest 2 View  02/15/2014   CLINICAL DATA:  Two weeks of shortness breath weakness and fatigue semi: History of diabetes and renal insufficiency  EXAM: CHEST  2 VIEW  COMPARISON:  Portable chest x-ray of September 21, 2012  FINDINGS: The lungs are mildly hypoinflated. There is mild linear density at the right lung base. The cardiac silhouette is enlarged. The pulmonary vascularity is mildly prominent centrally but some vascular crowding is occurring. There is no pleural effusion. The bony thorax exhibits no acute abnormality.  IMPRESSION: Bilateral pulmonary hypoinflation. There is right basilar atelectasis or early pneumonia. A follow-up chest x-ray with straight AP positioning or better yet a PA and lateral study would be useful.   Electronically Signed   By: David  Martinique   On: 02/15/2014 17:32    Chart has been reviewed  Assessment/Plan  79 yo male with history of nonalcoholic cirrhosis, here with worsening fluid overload and worsening liver and renal function  Present on Admission:  . Acute on chronic renal failure - but concerning for hepatorenal syndrome.  Discussed with nephrology obtain urine electrolytes gentle Lasix given fluid overload pain renal ultrasound  . HTN (hypertension) - see home medications  . Obstructive sleep apnea - CPAP . Hypoxia - she unable to take deep breaths secondary to abdominal distention. No evidence OR off significant hypoxia while monitored. No chest pains no shortness of breath patient with elevated INR and low platelets and low risk for PE  . Debility - PT was evaluation  . Non-alcoholic cirrhosis - worsening liver function patient's been  followed by [facility] will need to arrange for follow-up, discharge. Decrease in albumin likely contributing to peripheral edema and abdominal distention likely ascites  . Esophageal varices - continue propranolol   Prophylaxis: SCD , Protonix  CODE STATUS:  FULL CODE    Other plan as per orders.  I have spent a total of 65 min on this admission discussed with nephrology Dr.Colondonato  Samarah Hogle 02/15/2014, 7:57 PM  [facility]  Pager [phone]   after 2 AM please page floor coverage PA If 7AM-7PM, please contact the day team taking care of the patient  [email or web address]  Password [email or web address]

## 2014-02-16 ENCOUNTER — Inpatient Hospital Stay (HOSPITAL_COMMUNITY): Payer: BC Managed Care – PPO

## 2014-02-16 DIAGNOSIS — D61818 Other pancytopenia: Secondary | ICD-10-CM

## 2014-02-16 DIAGNOSIS — R601 Generalized edema: Secondary | ICD-10-CM

## 2014-02-16 DIAGNOSIS — K7460 Unspecified cirrhosis of liver: Principal | ICD-10-CM

## 2014-02-16 DIAGNOSIS — J9601 Acute respiratory failure with hypoxia: Secondary | ICD-10-CM

## 2014-02-16 LAB — COMPREHENSIVE METABOLIC PANEL
ALBUMIN: 1.8 g/dL — AB (ref 3.5–5.2)
ALK PHOS: 102 U/L (ref 39–117)
ALT: 23 U/L (ref 0–53)
AST: 64 U/L — ABNORMAL HIGH (ref 0–37)
Anion gap: 6 (ref 5–15)
BUN: 15 mg/dL (ref 6–23)
CO2: 32 mmol/L (ref 19–32)
Calcium: 8.1 mg/dL — ABNORMAL LOW (ref 8.4–10.5)
Chloride: 104 mmol/L (ref 96–112)
Creatinine, Ser: 1.38 mg/dL — ABNORMAL HIGH (ref 0.50–1.35)
GFR calc Af Amer: 56 mL/min — ABNORMAL LOW (ref >90)
GFR, EST NON AFRICAN AMERICAN: 48 mL/min — AB (ref >90)
GLUCOSE: 96 mg/dL (ref 70–99)
POTASSIUM: 3.7 mmol/L (ref 3.5–5.1)
Sodium: 142 mmol/L (ref 135–145)
Total Bilirubin: 2.6 mg/dL — ABNORMAL HIGH (ref 0.3–1.2)
Total Protein: 6.3 g/dL (ref 6.0–8.3)

## 2014-02-16 LAB — CBC
HCT: 32 % — ABNORMAL LOW (ref 39.0–52.0)
Hemoglobin: 10.8 g/dL — ABNORMAL LOW (ref 13.0–17.0)
MCH: 32.9 pg (ref 26.0–34.0)
MCHC: 33.8 g/dL (ref 30.0–36.0)
MCV: 97.6 fL (ref 78.0–100.0)
Platelets: 54 10*3/uL — ABNORMAL LOW (ref 150–400)
RBC: 3.28 MIL/uL — ABNORMAL LOW (ref 4.22–5.81)
RDW: 16 % — AB (ref 11.5–15.5)
WBC: 3.1 10*3/uL — ABNORMAL LOW (ref 4.0–10.5)

## 2014-02-16 LAB — SODIUM, URINE, RANDOM: SODIUM UR: 129 mmol/L

## 2014-02-16 LAB — GLUCOSE, CAPILLARY
GLUCOSE-CAPILLARY: 84 mg/dL (ref 70–99)
GLUCOSE-CAPILLARY: 140 mg/dL — AB (ref 70–99)
GLUCOSE-CAPILLARY: 113 mg/dL — AB (ref 70–99)
GLUCOSE-CAPILLARY: 88 mg/dL (ref 70–99)
Glucose-Capillary: 82 mg/dL (ref 70–99)
Glucose-Capillary: 89 mg/dL (ref 70–99)

## 2014-02-16 LAB — CREATININE, URINE, RANDOM: CREATININE, URINE: 31.53 mg/dL

## 2014-02-16 LAB — TSH: TSH: 4.52 u[IU]/mL — ABNORMAL HIGH (ref 0.350–4.500)

## 2014-02-16 LAB — PHOSPHORUS: Phosphorus: 3.7 mg/dL (ref 2.3–4.6)

## 2014-02-16 LAB — MAGNESIUM: MAGNESIUM: 2 mg/dL (ref 1.5–2.5)

## 2014-02-16 MED ORDER — INSULIN ASPART PROT & ASPART (70-30 MIX) 100 UNIT/ML ~~LOC~~ SUSP
25.0000 [IU] | Freq: Two times a day (BID) | SUBCUTANEOUS | Status: DC
  Administered 2014-02-16 – 2014-02-18 (×4): 25 [IU] via SUBCUTANEOUS
  Filled 2014-02-16: qty 10

## 2014-02-16 MED ORDER — FUROSEMIDE 10 MG/ML IJ SOLN
80.0000 mg | Freq: Two times a day (BID) | INTRAMUSCULAR | Status: DC
  Administered 2014-02-16 – 2014-02-18 (×4): 80 mg via INTRAVENOUS
  Filled 2014-02-16 (×6): qty 8

## 2014-02-16 MED ORDER — POTASSIUM CHLORIDE CRYS ER 20 MEQ PO TBCR
40.0000 meq | EXTENDED_RELEASE_TABLET | Freq: Once | ORAL | Status: AC
  Administered 2014-02-16: 40 meq via ORAL
  Filled 2014-02-16: qty 2

## 2014-02-16 NOTE — Evaluation (Signed)
Occupational Therapy Evaluation Patient Details Name: [name] MRN: [id number] DOB: [birth year] Today's Date: 02/16/2014    History of Present Illness This 79 y.o male with h/o NASH (nonalcoholic steatohepatitis) admitted with hypoxemia and worsening LE edema, increased weakness.  DX: CKD stage III; ARF.  PMH includes:   HTN, NASH, OSA, DM, GERD, Pantocytopenia     Clinical Impression   Pt admitted with above. He demonstrates the below listed deficits and will benefit from continued OT to maximize safety and independence with BADLs.  Pt presents to OT with generalized weakness, impaired balance, and impaired cognition.  He lives with his wife who provides 24 hour assist (he required ~ min A overall).  Wife reports feeling overwhelmed and needing help at home.  Discussed SNF level rehab, but she wants him to return home.  At this time recommend [place], PT, aide.  She is also requesting a transport w/c and BSC.       Follow Up Recommendations  Home health OT;Supervision/Assistance - 24 hour;Other (comment) (HHaide)    Equipment Recommendations  3 in 1 bedside comode;Wheelchair (measurements OT);Other (comment) (transport wheelchair )    Recommendations for Other Services       Precautions / Restrictions Precautions Precautions: Fall Precaution Comments: wife reports falls at home       Mobility Bed Mobility Overal bed mobility: Needs Assistance Bed Mobility: Supine to Sit     Supine to sit: Supervision        Transfers Overall transfer level: Needs assistance   Transfers: Sit to/from Stand;Stand Pivot Transfers Sit to Stand: Min assist Stand pivot transfers: Min assist       General transfer comment: Min A to steady     Balance Overall balance assessment: Needs assistance Sitting-balance support: Feet supported Sitting balance-Leahy Scale: Good     Standing balance support: During functional activity Standing balance-Leahy Scale: Fair                               ADL Overall ADL's : Needs assistance/impaired Eating/Feeding: Independent;Sitting   Grooming: Wash/dry hands;Wash/dry face;Oral care;Brushing hair;Minimal assistance;Standing   Upper Body Bathing: Supervision/ safety;Sitting   Lower Body Bathing: Minimal assistance;Sit to/from stand   Upper Body Dressing : Minimal assistance;Sitting   Lower Body Dressing: Minimal assistance;Sit to/from stand   Toilet Transfer: Minimal assistance;Ambulation;Comfort height toilet   Toileting- Clothing Manipulation and Hygiene: Minimal assistance;Sit to/from stand       Functional mobility during ADLs: Minimal assistance General ADL Comments: Pt requires min a for balance and due to fatigue       Vision                 Additional Comments: Pt and wife report he had cataract removal in 2015 without significant change/improvement in vision.  Pt reports difficulty seeing low contrast objects or in low lighting    Perception     Praxis      Pertinent Vitals/Pain Pain Assessment: No/denies pain     Hand Dominance Right   Extremity/Trunk Assessment Upper Extremity Assessment Upper Extremity Assessment: Generalized weakness;LUE deficits/detail;RUE deficits/detail RUE Deficits / Details: impaired sensation and coordination  RUE Sensation: decreased light touch RUE Coordination: decreased fine motor LUE Deficits / Details: impaired coordination and sensation  LUE Sensation: decreased light touch LUE Coordination: decreased fine motor   Lower Extremity Assessment Lower Extremity Assessment: Defer to PT evaluation   Cervical / Trunk Assessment Cervical / Trunk Assessment:  Normal   Communication Communication Communication: HOH   Cognition Arousal/Alertness: Awake/alert Behavior During Therapy: WFL for tasks assessed/performed Overall Cognitive Status: Impaired/Different from baseline Area of Impairment: Attention;Problem solving;Awareness;Safety/judgement    Current Attention Level: Selective (with verbal cues )     Safety/Judgement: Decreased awareness of safety Awareness: Intellectual Problem Solving: Slow processing;Difficulty sequencing;Requires verbal cues;Requires tactile cues General Comments: Pt with confusion.  Wife reports his cognitive performance varies.  He will at times interject information that does not pertain to task at hand, or conversation.  Needs redirection to re-attend to task    General Comments       Exercises       Shoulder Instructions      Home Living Family/patient expects to be discharged to:: Private residence Living Arrangements: Spouse/significant other Available Help at Discharge: Family;Available 24 hours/day Type of Home: House Home Access: Stairs to enter     Home Layout: Two level;1/2 bath on main level     Bathroom Shower/Tub: Occupational psychologist: Handicapped height     Home Equipment: Walker - standard;Bedside commode   Additional Comments: wife reports they have standard walkers that were her mother's,and possibly a BSC "upstairs"      Prior Functioning/Environment Level of Independence: Needs assistance  Gait / Transfers Assistance Needed: Pt ambulated in the house with no AD, but does have h/o falls.  Wife reports he fatigues rapidly and needs assist for community ambulation  ADL's / Homemaking Assistance Needed: Wife reports she had to assist pt with bathing and dressing (amount of assist varied depending on his fatigue levels and confusion )   Comments: wife recently had cataract surgery and is limited on her ability to assist pt.  She reports feeling overwhelmed with his care     OT Diagnosis: Generalized weakness;Cognitive deficits   OT Problem List: Decreased strength;Decreased activity tolerance;Impaired balance (sitting and/or standing);Decreased coordination;Decreased cognition;Decreased safety awareness;Decreased knowledge of use of DME or AE   OT  Treatment/Interventions: Self-care/ADL training;DME and/or AE instruction;Therapeutic activities;Cognitive remediation/compensation;Patient/family education;Balance training    OT Goals(Current goals can be found in the care plan section) Acute Rehab OT Goals Patient Stated Goal: To get stronger  OT Goal Formulation: With patient/family Time For Goal Achievement: 03/02/14 Potential to Achieve Goals: Good ADL Goals Pt Will Perform Grooming: with supervision;standing Pt Will Perform Lower Body Bathing: with supervision;sit to/from stand Pt Will Perform Lower Body Dressing: with supervision;sit to/from stand Pt Will Transfer to Toilet: with supervision;ambulating;regular height toilet;bedside commode;grab bars Pt Will Perform Toileting - Clothing Manipulation and hygiene: with supervision;sit to/from stand Pt Will Perform Tub/Shower Transfer: Shower transfer;with supervision;grab bars;rolling walker;shower seat;ambulating Additional ADL Goal #1: Pt will participate in 25 mins therapeutic activities with no more than 2 rest breaks   OT Frequency: Min 2X/week   Barriers to D/C:            Co-evaluation              End of Session Nurse Communication: Mobility status  Activity Tolerance: Patient tolerated treatment well Patient left: in chair;with call bell/phone within reach;with family/visitor present (PT to place chair alarm )   Time: 1355-1430 OT Time Calculation (min): 35 min Charges:  OT General Charges $OT Visit: 1 Procedure OT Evaluation $Initial OT Evaluation Tier I: 1 Procedure OT Treatments $Self Care/Home Management : 8-22 mins G-Codes:    Veyda Kaufman M 03-06-2014, 2:58 PM

## 2014-02-16 NOTE — H&P (Signed)
Reason for Consult: Chronic renal failure, fluid overload  Referring Physician: [facility]   HPI: [name] is an 79 y.o. male with PMH NASH (nonalcoholic steatohepatitis); Diabetes mellitus without complication; Kidney disease; Hypertension; GERD (gastroesophageal reflux disease); Depression; Anxiety; Sleep apnea; Anemia; and Esophageal varices.    Admitted on 02/15/2014 for hypoxemia and worsening lower extremity edema. His edema started about a week ago. He has not missed any doses of his lasix except the night before admission.  He was seen at his PCP's office and noted to have an oxygen desaturation so was transported to the [facility] ED.  His wife reported that he was much weaker than usual. Denies any fevers, chills, chest pain. Hasn't had any sick contacts. Having normal voids and normal bowel movements for him. He reports that his baseline weight is around 215 pounds and presented with an 8 pound increase.  He eats about three meals a day but doesn't drink much water. He drinks about 3-4 cans of diet soda per day. He does get short of breath when he reaches the top of a staircase. He received a CT w/ contrast of his abdomen at [facility] on January 12.  Evaluation found a urine sodium of 129- he was placed on lasix 40 IV BID only 500 of UOP recorded- using condom cath- pt reports it being dislodged overnight and them "needing to change the bed" so UOP may be underestimate.  Weight did go up from yesterday to today however.  He is followed at [facility] for this stage 3 CKD by Dr. Posey Pronto   Trend in Creatinine: CREATININE, SER  Date/Time Value Ref Range Status  02/16/2014 08:56 AM 1.38* 0.50 - 1.35 mg/dL Final  02/15/2014 04:07 PM 1.42* 0.50 - 1.35 mg/dL Final  09/23/2012 06:40 AM 1.33 0.50 - 1.35 mg/dL Final  09/22/2012 05:18 AM 1.39* 0.50 - 1.35 mg/dL Final  09/21/2012 05:05 PM 1.37* 0.50 - 1.35 mg/dL Final  03/09/2008 10:10 AM 1.08 0.4 - 1.5 mg/dL Final  04/03/2007 05:05 AM 1.05  Final  04/02/2007 02:40 PM  1.35  Final    PMH:   Past Medical History  Diagnosis Date  . NASH (nonalcoholic steatohepatitis)   . Diabetes mellitus without complication   . Kidney disease   . Hypertension   . GERD (gastroesophageal reflux disease)     esophageal varices  . Depression   . Anxiety   . Sleep apnea     use CPAP-14  . Anemia   . Esophageal varices     PSH:   Past Surgical History  Procedure Laterality Date  . Tonsillectomy    . Left arm surgery  1984    crush injury-pins  . Esophagogastroduodenoscopy (egd) with propofol N/A 11/18/2012    Procedure: ESOPHAGOGASTRODUODENOSCOPY (EGD) WITH PROPOFOL;  Surgeon: Cleotis Nipper, MD;  Location: [facility] ENDOSCOPY;  Service: Endoscopy;  Laterality: N/A;  . Colonoscopy with propofol N/A 11/18/2012    Procedure: COLONOSCOPY WITH PROPOFOL;  Surgeon: Cleotis Nipper, MD;  Location: [facility] ENDOSCOPY;  Service: Endoscopy;  Laterality: N/A;    Allergies:  Allergies  Allergen Reactions  . Aldactone [Spironolactone] Swelling    Breast swell, painful    Medications:   Prior to Admission medications   Medication Sig Start Date End Date Taking? Authorizing Provider  aMILoride (MIDAMOR) 5 MG tablet Take 5 mg by mouth daily.   Yes Historical Provider, MD  cholecalciferol (VITAMIN D) 1000 UNITS tablet Take 1,000 Units by mouth every morning.   Yes Historical Provider, MD  clotrimazole-betamethasone (LOTRISONE) cream Apply 1 application topically 2 (two) times daily.   Yes Historical Provider, MD  ferrous sulfate 325 (65 FE) MG tablet Take 325 mg by mouth at bedtime.   Yes Historical Provider, MD  furosemide (LASIX) 40 MG tablet Take 40 mg by mouth 2 (two) times daily.   Yes Historical Provider, MD  insulin aspart protamine- aspart (NOVOLOG MIX 70/30) (70-30) 100 UNIT/ML injection Inject 30 Units into the skin 2 (two) times daily with a meal.   Yes Historical Provider, MD  ketoconazole (NIZORAL) 2 % shampoo Apply 1 application topically 2 (two) times a week.   Yes  Historical Provider, MD  lactulose (CHRONULAC) 10 GM/15ML solution Take 10 g by mouth 3 (three) times daily.   Yes Historical Provider, MD  Multiple Vitamin (MULTIVITAMIN WITH MINERALS) TABS tablet Take 1 tablet by mouth daily.   Yes Historical Provider, MD  omeprazole (PRILOSEC) 20 MG capsule Take 20 mg by mouth daily.   Yes Historical Provider, MD  pantoprazole (PROTONIX) 20 MG tablet Take 20 mg by mouth daily.   Yes Historical Provider, MD  PRESCRIPTION MEDICATION Apply 1 application topically daily as needed. Eucerin/Triamcinolone 0.1% apply for dry skin   Yes Historical Provider, MD  Probiotic Product (PROBIOTIC DAILY) CAPS Take 2 capsules by mouth every morning.   Yes Historical Provider, MD  propranolol (INDERAL) 10 MG tablet Take 10 mg by mouth 2 (two) times daily.   Yes Historical Provider, MD  rifaximin (XIFAXAN) 550 MG TABS tablet Take 550 mg by mouth 2 (two) times daily.   Yes Historical Provider, MD  rosuvastatin (CRESTOR) 20 MG tablet Take 10 mg by mouth every evening.    Yes Historical Provider, MD  solifenacin (VESICARE) 10 MG tablet Take 10 mg by mouth daily.   Yes Historical Provider, MD  venlafaxine (EFFEXOR) 75 MG tablet Take 75 mg by mouth 2 (two) times daily.   Yes Historical Provider, MD    Inpatient medications: . aMILoride  5 mg Oral Daily  . darifenacin  15 mg Oral Daily  . ferrous sulfate  325 mg Oral QHS  . furosemide  40 mg Intravenous BID  . guaiFENesin  600 mg Oral BID  . insulin aspart  0-5 Units Subcutaneous QHS  . insulin aspart  0-9 Units Subcutaneous TID WC  . insulin aspart protamine- aspart  25 Units Subcutaneous BID WC  . lactulose  10 g Oral TID  . pantoprazole  20 mg Oral QHS  . propranolol  10 mg Oral BID  . rifaximin  550 mg Oral BID  . rosuvastatin  10 mg Oral QPM  . sodium chloride  3 mL Intravenous Q12H  . venlafaxine  75 mg Oral BID    Discontinued Meds:   Medications Discontinued During This Encounter  Medication Reason  . aspirin EC  81 MG tablet Change in therapy  . Insulin Detemir (LEVEMIR FLEXPEN) 100 UNIT/ML SOPN Change in therapy  . losartan (COZAAR) 100 MG tablet Change in therapy  . 0.9 %  sodium chloride infusion   . insulin aspart (novoLOG) injection 0-9 Units   . furosemide (LASIX) tablet 40 mg   . insulin aspart protamine- aspart (NOVOLOG MIX 70/30) injection 30 Units   . HYDROcodone-acetaminophen (NORCO/VICODIN) 5-325 MG per tablet 1-2 tablet   . insulin aspart (novoLOG) injection 0-9 Units     Social History:  reports that he has never smoked. He does not have any smokeless tobacco history on file. He reports that he drinks alcohol.  He reports that he does not use illicit drugs.  Family History:   Family History  Problem Relation Age of Onset  . Diabetes type II Mother   . Alcoholism Father   . Diabetes type II Sister     Pertinent items are noted in HPI. Weight change:   Intake/Output Summary (Last 24 hours) at 02/16/14 1343 Last data filed at 02/16/14 0203  Gross per 24 hour  Intake      0 ml  Output    500 ml  Net   -500 ml   BP 117/51 mmHg  Pulse 62  Temp(Src) 98.6 F (37 C) (Oral)  Resp 19  Ht 5\' 7"  (1.702 m)  Wt 229 lb 11.5 oz (104.2 kg)  BMI 35.97 kg/m2  SpO2 94% Filed Vitals:   02/15/14 2045 02/16/14 0201 02/16/14 0620 02/16/14 0935  BP: 113/48 120/53 106/89 117/51  Pulse: 63 59 69 62  Temp:  98.7 F (37.1 C) 98.5 F (36.9 C) 98.6 F (37 C)  TempSrc:  Oral Oral Oral  Resp: 22 18 18 19   Height:  5\' 7"  (1.702 m)    Weight:  229 lb 11.5 oz (104.2 kg)    SpO2: 96% 94% 90% 94%     General appearance: alert, cooperative, no distress and slowed mentation Resp: clear to auscultation bilaterally Cardio: regular rate and rhythm, S1, S2 normal, no murmur, click, rub or gallop GI: mildly distended soft, NT, normal BS. no appreciated fluid wave  Extremities: edema +1 b/l LE  Pulses: 2+ and symmetric Skin: Skin color, texture, turgor normal. No rashes or lesions or chronic skin  changes Neurologic: Grossly normal  Labs: Basic Metabolic Panel:  Recent Labs Lab 02/15/14 1607 02/16/14 0856  NA 141 142  K 4.0 3.7  CL 103 104  CO2 32 32  GLUCOSE 174* 96  BUN 15 15  CREATININE 1.42* 1.38*  ALBUMIN 2.0* 1.8*  CALCIUM 8.4 8.1*  PHOS  --  3.7   Liver Function Tests:  Recent Labs Lab 02/15/14 1607 02/16/14 0856  AST 74* 64*  ALT 23 23  ALKPHOS 114 102  BILITOT 2.6* 2.6*  PROT 6.5 6.3  ALBUMIN 2.0* 1.8*   No results for input(s): LIPASE, AMYLASE in the last 168 hours.  Recent Labs Lab 02/15/14 1607  AMMONIA 63*   CBC:  Recent Labs Lab 02/15/14 1607 02/16/14 0856  WBC 4.2 3.1*  HGB 11.9* 10.8*  HCT 35.4* 32.0*  MCV 99.2 97.6  PLT 81* 54*   PT/INR: @LABRCNTIP (inr:5) Cardiac Enzymes: ) Recent Labs Lab 02/15/14 1607  TROPONINI 0.03   CBG:  Recent Labs Lab 02/15/14 2140 02/16/14 0209 02/16/14 0623 02/16/14 0838 02/16/14 1133  GLUCAP 175* 84 82 89 140*    Iron Studies: No results for input(s): IRON, TIBC, TRANSFERRIN, FERRITIN in the last 168 hours.  Xrays/Other Studies: Dg Chest 2 View  02/16/2014   CLINICAL DATA:  Hypoxia, confusion  EXAM: CHEST  2 VIEW  COMPARISON:  02/15/2014  FINDINGS: Cardiomegaly again noted. Central mild vascular congestion. Patchy bilateral basilar atelectasis or infiltrate with slight worsening in aeration from prior exam.  IMPRESSION: Central mild vascular congestion. Patchy bilateral basilar atelectasis or infiltrate with slight worsening and are aeration.   Electronically Signed   By: Lahoma Crocker M.D.   On: 02/16/2014 08:14   Dg Chest 2 View  02/15/2014   CLINICAL DATA:  Two weeks of shortness breath weakness and fatigue semi: History of diabetes and renal insufficiency  EXAM: CHEST  2 VIEW  COMPARISON:  Portable chest x-ray of September 21, 2012  FINDINGS: The lungs are mildly hypoinflated. There is mild linear density at the right lung base. The cardiac silhouette is enlarged. The pulmonary  vascularity is mildly prominent centrally but some vascular crowding is occurring. There is no pleural effusion. The bony thorax exhibits no acute abnormality.  IMPRESSION: Bilateral pulmonary hypoinflation. There is right basilar atelectasis or early pneumonia. A follow-up chest x-ray with straight AP positioning or better yet a PA and lateral study would be useful.   Electronically Signed   By: David  Martinique   On: 02/15/2014 17:32   US Abdomen Complete  02/16/2014   CLINICAL DATA:  Cirrhosis secondary to NASH, history hypertension, diabetes, acute on chronic renal failure, esophageal varices, GERD  EXAM: ULTRASOUND ABDOMEN COMPLETE  COMPARISON:  09/06/2013  FINDINGS: Gallbladder: Small amount of sludge within gallbladder. Upper normal gallbladder wall thickness, nonspecific in setting of ascites. No shadowing calculi or sonographic Murphy sign.  Common bile duct: Diameter: Normal caliber 3 mm diameter  Liver: Heterogeneous minimally coarsened echogenicity with nodular hepatic margins consistent with cirrhosis. Hepatopetal portal venous flow. No definite focal mass identified though portions of the LEFT lobe are inadequately visualized due to shadowing from bowel gas.  IVC: No abnormality visualized.  Pancreas: Visualized portion unremarkable.  Spleen: Incompletely visualized due to body habitus and bowel gas. Morphologically demonstrates rounded margins and suspect is enlarged though unable to accurately measure. No visualized focal mass lesion.  Right Kidney: Length: 10.5 cm. Cortical thinning. Upper normal cortical echogenicity. No mass or hydronephrosis.  Left Kidney: Length: 10.4 cm. Age-related cortical thinning. Upper normal echogenicity. No mass or hydronephrosis.  Abdominal aorta: Obscured by bowel gas  Other findings: Ascites in upper abdomen.  IMPRESSION: Limited exam demonstrating cirrhotic liver, ascites and probable splenomegaly though spleen is incompletely visualized.  No additional abnormalities  identified by limited sonographic assessment.  If better intrahepatic visualization is required such S for hepatic cellular carcinoma screening, consider MR imaging with and without contrast.   Electronically Signed   By: Lavonia Dana M.D.   On: 02/16/2014 13:11     Assessment/Plan: 1. CKD 3: Scr appears to be at baseline and but minimal UOP. Ab US showing age related cortical thickening and no hydonephrnosis. Would consider increasing lasix and continue amiloride.  Again UOP may not be accurate but he definitely is volume overloaded so will increase lasix to 80 IV BID for now and follow.  Urine sodium and creatinine trend thankfully not consistent with hepatorenal syndrome.  I will also give a dose of K dur 40 Meq tonight as feel like his potassium will decrease with diuresis.   2. Fluid overload/anasarca: related to hypo-albuminemia from cirrhosis. Continue fluid restriction 1200 mL  3. Acute hypoxic Resp failure  4. NASH cirrhosis/esophageal varices hepatic encephalopathy history - supportive care 5. HTN  6. OSA  7. DM2 8. GERD  9. Pancytopenia    Rosemarie Ax 02/16/2014, 1:43 PM   Patient seen and examined, agree with above note with above modifications. Pleasant 79 year old WM with cirrhosis due to NASH also with stage 3 CKD followed by Dr. Posey Pronto at [facility].  He presents with hypoxia and volume overload with weight gain.  Diuresis was attempted but seems that UOP is down and weight is up- he is indeed volume overloaded so will increase IV lasix to 80 BID and continue the amiloride.  We hopefully will be able to achieve diuresis without worsening in  renal function.  Corliss Parish, MD 02/16/2014

## 2014-02-16 NOTE — Progress Notes (Signed)
PT Cancellation Note  Patient Details Name: [name] MRN: [id number] DOB: 1935/04/08   Cancelled Treatment:   Reason Eval/Treat Not Completed: Pt currently eating his breakfast (received tray late), and is focused on his food at this time. Will check back as time allows to complete PT eval.    Rolinda Roan 02/16/2014, 11:49 AM   Rolinda Roan, PT, DPT Acute Rehabilitation Services Pager: [phone]

## 2014-02-16 NOTE — Evaluation (Signed)
Physical Therapy Evaluation Patient Details Name: [name] MRN: [id number] DOB: 1935-12-08 Today's Date: 02/16/2014   History of Present Illness  This 79 y.o male with h/o NASH (nonalcoholic steatohepatitis) admitted with hypoxemia and worsening LE edema, increased weakness.  DX: CKD stage III; ARF.  PMH includes:   HTN, NASH, OSA, DM, GERD, Pantocytopenia    Clinical Impression  Pt admitted with above diagnosis. Pt currently with functional limitations due to the deficits listed below (see PT Problem List). At the time of PT eval pt was able to perform transfers and ambulation with steadying (min) assist only. Pt was able to negotiate 11 steps without assistance, however pt's wife states he has fallen on the steps at home. Pt will benefit from skilled PT to increase their independence and safety with mobility to allow discharge to the venue listed below.       Follow Up Recommendations Home health PT;Supervision for mobility/OOB    Equipment Recommendations  Other (comment) (Pt and wife interested in a rollator, transport w/c, and a tub bench with a back rest on it)    Recommendations for Other Services       Precautions / Restrictions Precautions Precautions: Fall Precaution Comments: wife reports falls at home       Mobility  Bed Mobility Overal bed mobility: Needs Assistance Bed Mobility: Supine to Sit     Supine to sit: Supervision     General bed mobility comments: Pt was recieved sitting up in the recliner  Transfers Overall transfer level: Needs assistance Equipment used: None Transfers: Sit to/from Stand Sit to Stand: Min assist Stand pivot transfers: Min assist       General transfer comment: Steadying assist only required.   Ambulation/Gait Ambulation/Gait assistance: Min assist Ambulation Distance (Feet): 300 Feet Assistive device: None Gait Pattern/deviations: Step-through pattern;Decreased stride length Gait velocity: Decreased Gait velocity  interpretation: Below normal speed for age/gender General Gait Details: Pt required min assist for LOB with direction changes x2. Pt was able to ambulate a good distance and reported no SOB or fatigue. Pt states he feels as if he is doing better today than he normally does with his DOE.   Stairs Stairs: Yes Stairs assistance: Min guard Stair Management: One rail Right;Step to pattern Number of Stairs: 11 General stair comments: Pt was cued for sequencing and general safety awareness on stairs.   Wheelchair Mobility    Modified Rankin (Stroke Patients Only)       Balance Overall balance assessment: Needs assistance Sitting-balance support: Feet supported;No upper extremity supported Sitting balance-Leahy Scale: Good     Standing balance support: No upper extremity supported;During functional activity Standing balance-Leahy Scale: Fair                               Pertinent Vitals/Pain Pain Assessment: No/denies pain    Home Living Family/patient expects to be discharged to:: Private residence Living Arrangements: Spouse/significant other Available Help at Discharge: Family;Available 24 hours/day Type of Home: House Home Access: Stairs to enter   [employer] of Steps: 4 Home Layout: Two level;1/2 bath on main level Home Equipment: Walker - standard;Bedside commode Additional Comments: wife reports they have standard walkers that were her mother's,and possibly a BSC "upstairs"    Prior Function Level of Independence: Needs assistance   Gait / Transfers Assistance Needed: Pt ambulated in the house with no AD, but does have h/o falls.  Wife reports he fatigues rapidly  and needs assist for community ambulation   ADL's / Homemaking Assistance Needed: Wife reports she had to assist pt with bathing and dressing (amount of assist varied depending on his fatigue levels and confusion ). Typically she is present for safety during bathing and mostly assists  for drying off and dressing tasks.   Comments: wife recently had cataract surgery and is limited on her ability to assist pt.  She reports feeling overwhelmed with his care      Hand Dominance   Dominant Hand: Right    Extremity/Trunk Assessment   Upper Extremity Assessment: Defer to OT evaluation RUE Deficits / Details: impaired sensation and coordination    RUE Sensation: decreased light touch LUE Deficits / Details: impaired coordination and sensation    Lower Extremity Assessment: Overall WFL for tasks assessed      Cervical / Trunk Assessment: Normal  Communication   Communication: HOH  Cognition Arousal/Alertness: Awake/alert Behavior During Therapy: WFL for tasks assessed/performed Overall Cognitive Status: Impaired/Different from baseline Area of Impairment: Attention;Problem solving;Awareness;Safety/judgement   Current Attention Level: Selective (With verbal cues)     Safety/Judgement: Decreased awareness of safety Awareness: Intellectual Problem Solving: Slow processing;Difficulty sequencing;Requires verbal cues;Requires tactile cues General Comments: Pt with confusion.  Wife reports his cognitive performance varies.  He will at times interject information that does not pertain to task at hand, or conversation.  Needs redirection to re-attend to task     General Comments General comments (skin integrity, edema, etc.): wife present during eval     Exercises        Assessment/Plan    PT Assessment Patient needs continued PT services  PT Diagnosis Difficulty walking   PT Problem List Decreased strength;Decreased range of motion;Decreased activity tolerance;Decreased balance;Decreased mobility;Decreased knowledge of use of DME;Decreased safety awareness;Decreased knowledge of precautions;Cardiopulmonary status limiting activity  PT Treatment Interventions DME instruction;Gait training;Stair training;Functional mobility training;Therapeutic  activities;Therapeutic exercise;Neuromuscular re-education;Patient/family education   PT Goals (Current goals can be found in the Care Plan section) Acute Rehab PT Goals Patient Stated Goal: To get stronger  PT Goal Formulation: With patient/family Time For Goal Achievement: 02/23/14 Potential to Achieve Goals: Good    Frequency Min 3X/week   Barriers to discharge        Co-evaluation               End of Session Equipment Utilized During Treatment: Gait belt Activity Tolerance: Patient tolerated treatment well Patient left: in chair;with chair alarm set;with call bell/phone within reach;with family/visitor present Nurse Communication: Mobility status         Time: 8099-8338 PT Time Calculation (min) (ACUTE ONLY): 26 min   Charges:   PT Evaluation $Initial PT Evaluation Tier I: 1 Procedure PT Treatments $Gait Training: 8-22 mins   PT G Codes:        Rolinda Roan 2014/02/28, 3:25 PM  Rolinda Roan, PT, DPT Acute Rehabilitation Services Pager: [phone]

## 2014-02-16 NOTE — Progress Notes (Signed)
INITIAL NUTRITION ASSESSMENT  DOCUMENTATION CODES Per approved criteria  -Obesity Unspecified   INTERVENTION: Continue to monitor and encourage PO intake  NUTRITION DIAGNOSIS: Increased nutrient needs related to chronic illness as evidenced by estimated energy requirements; ongoing  Goal: Pt to meet >/= 90% of estimated energy requirements; met  Monitor:  PO intake, weight trends, labs   Reason for Assessment: Consult for assessment of nutrition requirements/needs   79 y.o. male  Admitting Dx: Nonalcoholic cirrhosis, kidney disease  ASSESSMENT: 79 y/o male with past medical hx of NASH; DM2 without complication, kidney disease, HTN, GERD, anemia and esophageal varices. Pt present with diagnosis of NASH followed by [facility] with hx of hepatic encephalopathy, renal insufficiency and varices. Pt admitted after he was found to be hypoxic during routine PCP visit down to 60s. He has worsening L/E edema, with fluid build up 14 lbs. Decrease in albumin, likely contributing to peripheral edema and abdominal distention. Wife reports that he has been much weaker. Pt reports having approximately 5 bowel movements per day.  Labs: hypocalcemia, low creatinine  Pt confirmed recent wt gain d/t fluid accumulation. Pt's wife and daughter were present upon DI assessment. Pt reported usual weight was 213 lbs before edema. Wife reported some loss in appetite for the past 3 weeks related to accumulation of fluid in abdomin. Pt reported that he has been trying to manage his DM2 through diet. Daughter reported pt and wife go out to eat often, and rarely prepare their own meals.  DI provided Nutrition Care Manual education materials for Diabetes and Chronic Kidney Disease per family request.  He denied nausea, vomiting and diarrhea. NFPE shows mild wasting, r/t to age; not at nutritional risk at this time for malnutrition d/t healthy appetite, BMI.  Pt ate 100% of breakfast this morning and has good  appetite.  Continue to monitor and encourage PO intake.   Nutrition Focused Physical Exam:  Subcutaneous Fat:  Orbital Region: mild depletion Upper Arm Region: mild depletion Thoracic and Lumbar Region: WDL  Muscle:  Temple Region: mild depletion Clavicle Bone Region: mild depletion Clavicle and Acromion Bone Region: WDL Scapular Bone Region: WDL Dorsal Hand: WDL Patellar Region: WDL Anterior Thigh Region: edematous Posterior Calf Region: edematous  Edema: L/E 3+   Height: Ht Readings from Last 1 Encounters:  02/16/14 _0  (1.702 m)    Weight: Wt Readings from Last 1 Encounters:  02/16/14 229 lb 11.5 oz (104.2 kg)    Ideal Body Weight: 148 lbs (67.3 kg)  % Ideal Body Weight: 155%  Wt Readings from Last 10 Encounters:  02/16/14 229 lb 11.5 oz (104.2 kg)  11/05/12 226 lb (102.513 kg)  09/23/12 224 lb 10.4 oz (101.9 kg)  06/13/09 233 lb 5 oz (105.83 kg)  07/07/07 235 lb 6.1 oz (106.768 kg)    Usual Body Weight: 213 lbs (96.8 kg)  % Usual Body Weight: 107.5%  BMI:  Body mass index is 35.97 kg/(m^2). obesity II  Estimated Nutritional Needs: Kcal: 2100-2400 Protein: 115-125 grams Fluid: 1.2 fluid restriction   Skin: L/E 3+ edema  Diet Order: Diet renal/carb modified with 1200 ml fluid restriction  EDUCATION NEEDS: -No education needs identified at this time   Intake/Output Summary (Last 24 hours) at 02/16/14 0956 Last data filed at 02/16/14 0203  Gross per 24 hour  Intake      0 ml  Output    500 ml  Net   -500 ml    Last BM: PTA   Labs:  Recent Labs Lab 02/15/14 1607 02/16/14 0856  NA 141 142  K 4.0 3.7  CL 103 104  CO2 32 32  BUN 15 15  CREATININE 1.42* 1.38*  CALCIUM 8.4 8.1*  MG  --  2.0  PHOS  --  3.7  GLUCOSE 174* 96    CBG (last 3)   Recent Labs  02/16/14 0209 02/16/14 0623 02/16/14 0838  GLUCAP 84 82 89    Scheduled Meds: . aMILoride  5 mg Oral Daily  . darifenacin  15 mg Oral Daily  . ferrous sulfate  325  mg Oral QHS  . furosemide  40 mg Intravenous BID  . guaiFENesin  600 mg Oral BID  . insulin aspart  0-5 Units Subcutaneous QHS  . insulin aspart  0-9 Units Subcutaneous TID WC  . insulin aspart  0-9 Units Subcutaneous Q4H  . insulin aspart protamine- aspart  25 Units Subcutaneous BID WC  . lactulose  10 g Oral TID  . pantoprazole  20 mg Oral QHS  . propranolol  10 mg Oral BID  . rifaximin  550 mg Oral BID  . rosuvastatin  10 mg Oral QPM  . sodium chloride  3 mL Intravenous Q12H  . venlafaxine  75 mg Oral BID    Continuous Infusions:   Past Medical History  Diagnosis Date  . NASH (nonalcoholic steatohepatitis)   . Diabetes mellitus without complication   . Kidney disease   . Hypertension   . GERD (gastroesophageal reflux disease)     esophageal varices  . Depression   . Anxiety   . Sleep apnea     use CPAP-14  . Anemia   . Esophageal varices     Past Surgical History  Procedure Laterality Date  . Tonsillectomy    . Left arm surgery  1984    crush injury-pins  . Esophagogastroduodenoscopy (egd) with propofol N/A 11/18/2012    Procedure: ESOPHAGOGASTRODUODENOSCOPY (EGD) WITH PROPOFOL;  Surgeon: Cleotis Nipper, MD;  Location: [facility];  Service: Endoscopy;  Laterality: N/A;  . Colonoscopy with propofol N/A 11/18/2012    Procedure: COLONOSCOPY WITH PROPOFOL;  Surgeon: Cleotis Nipper, MD;  Location: [facility];  Service: Endoscopy;  Laterality: N/A;    Wynona Dove, MS Dietetic Intern Pager: [phone] ______________  RD read, reviewed, and agree with dietetic intern's note. Appropriate changes have been made.  Kallie Locks, MS, RD, LDN Pager # [phone] After hours/ weekend pager # [phone]

## 2014-02-16 NOTE — Progress Notes (Addendum)
PROGRESS NOTE    [name] SNK:[id number] DOB: Mar 20, 1935 DOA: 02/15/2014 PCP: Shirline Frees, MD  Primary Gastroenterologist: Dr. Ronald Lobo Primary Endocrinologist: Dr. Delrae Rend Primary Hepatologist: At [facility]  HPI/Brief narrative  79 year old male with history of NASH Cirrhosis-followed at [facility], hepatic encephalopathy, varices, DM 2, CKD, HTN, GERD, depression and anxiety, sleep apnea, treated found to be hypoxic in the 60s during routine PCP visit, increased lower extremity edema, 12 pound weight gain, elevated creatinine and associated weakness and difficulty ambulating. No reported confusion.   Assessment/Plan:  1. Fluid overload/anasarca: Secondary to liver disease and hypo-albuminemia. Continue Lasix & amiloride. Diuresing. Creatinine seems to be at baseline. 2. Acute hypoxic respiratory failure: DD-pulmonary edema related to fluid overload from cirrhosis, atelectasis. Mild abdominal distention probably not contributing much. No significant hypoxia in ED. Low index of suspicion for PE. EF normal 09/22/12.  3. Stage III chronic kidney disease: Baseline creatinine 1.3 and currently 1.38. Follow BMP. 4. NASH cirrhosis/esophageal varices hepatic encephalopathy history: Follows at [facility]. No significant ascites. Does have peripheral edema. Continue diuretics, propranolol, lactulose and rifaximin. Follow abdominal ultrasound report. 5. Essential hypertension: Controlled. 6. OSA: Continue nightly C Pap 7. DM 2: Place on reduced dose of 70/30 insulin and SSI. Controlled. 8. GERD: PPI 9. Pancytopenia: Chronic and seems stable. Likely related to cirrhosis. Follow CBCs   Code Status: Full Family Communication: None at bedside Disposition Plan: Home when medically stable.   Consultants:  None  Procedures:  None  Antibiotics:  None   Subjective: Denies complaints. No SOB. 12 pound weight gain over the last 3 weeks. Leg edema.  Objective: Filed Vitals:   02/15/14  2045 02/16/14 0201 02/16/14 0620 02/16/14 0935  BP: 113/48 120/53 106/89 117/51  Pulse: 63 59 69 62  Temp:  98.7 F (37.1 C) 98.5 F (36.9 C) 98.6 F (37 C)  TempSrc:  Oral Oral Oral  Resp: 22 18 18 19   Height:  5\' 7"  (1.702 m)    Weight:  104.2 kg (229 lb 11.5 oz)    SpO2: 96% 94% 90% 94%    Intake/Output Summary (Last 24 hours) at 02/16/14 1249 Last data filed at 02/16/14 0203  Gross per 24 hour  Intake      0 ml  Output    500 ml  Net   -500 ml   Filed Weights   02/15/14 1542 02/16/14 0201  Weight: 101.152 kg (223 lb) 104.2 kg (229 lb 11.5 oz)     Exam:  General exam: Pleasant elderly male lying comfortably supine in bed. Respiratory system: Clear. No increased work of breathing. Cardiovascular system: S1 & S2 heard, RRR. No JVD, murmurs, gallops, clicks. 1+ pitting bilateral leg edema. Gastrointestinal system: Abdomen is mildly distended, soft and nontender. Normal bowel sounds heard. No significant clinical ascites. Central nervous system: Alert and oriented. No focal neurological deficits. Extremities: Symmetric 5 x 5 power.   Data Reviewed: Basic Metabolic Panel:  Recent Labs Lab 02/15/14 1607 02/16/14 0856  NA 141 142  K 4.0 3.7  CL 103 104  CO2 32 32  GLUCOSE 174* 96  BUN 15 15  CREATININE 1.42* 1.38*  CALCIUM 8.4 8.1*  MG  --  2.0  PHOS  --  3.7   Liver Function Tests:  Recent Labs Lab 02/15/14 1607 02/16/14 0856  AST 74* 64*  ALT 23 23  ALKPHOS 114 102  BILITOT 2.6* 2.6*  PROT 6.5 6.3  ALBUMIN 2.0* 1.8*   No results for input(s): LIPASE, AMYLASE  in the last 168 hours.  Recent Labs Lab 02/15/14 1607  AMMONIA 63*   CBC:  Recent Labs Lab 02/15/14 1607 02/16/14 0856  WBC 4.2 3.1*  HGB 11.9* 10.8*  HCT 35.4* 32.0*  MCV 99.2 97.6  PLT 81* 54*   Cardiac Enzymes:  Recent Labs Lab 02/15/14 1607  TROPONINI 0.03   BNP (last 3 results) No results for input(s): PROBNP in the last 8760 hours. CBG:  Recent Labs Lab  02/15/14 2140 02/16/14 0209 02/16/14 0623 02/16/14 0838 02/16/14 1133  GLUCAP 175* 84 82 89 140*    No results found for this or any previous visit (from the past 240 hour(s)).         Studies: Dg Chest 2 View  02/16/2014   CLINICAL DATA:  Hypoxia, confusion  EXAM: CHEST  2 VIEW  COMPARISON:  02/15/2014  FINDINGS: Cardiomegaly again noted. Central mild vascular congestion. Patchy bilateral basilar atelectasis or infiltrate with slight worsening in aeration from prior exam.  IMPRESSION: Central mild vascular congestion. Patchy bilateral basilar atelectasis or infiltrate with slight worsening and are aeration.   Electronically Signed   By: Lahoma Crocker M.D.   On: 02/16/2014 08:14   Dg Chest 2 View  02/15/2014   CLINICAL DATA:  Two weeks of shortness breath weakness and fatigue semi: History of diabetes and renal insufficiency  EXAM: CHEST  2 VIEW  COMPARISON:  Portable chest x-ray of September 21, 2012  FINDINGS: The lungs are mildly hypoinflated. There is mild linear density at the right lung base. The cardiac silhouette is enlarged. The pulmonary vascularity is mildly prominent centrally but some vascular crowding is occurring. There is no pleural effusion. The bony thorax exhibits no acute abnormality.  IMPRESSION: Bilateral pulmonary hypoinflation. There is right basilar atelectasis or early pneumonia. A follow-up chest x-ray with straight AP positioning or better yet a PA and lateral study would be useful.   Electronically Signed   By: David  Martinique   On: 02/15/2014 17:32        Scheduled Meds: . aMILoride  5 mg Oral Daily  . darifenacin  15 mg Oral Daily  . ferrous sulfate  325 mg Oral QHS  . furosemide  40 mg Intravenous BID  . guaiFENesin  600 mg Oral BID  . insulin aspart  0-5 Units Subcutaneous QHS  . insulin aspart  0-9 Units Subcutaneous TID WC  . insulin aspart protamine- aspart  25 Units Subcutaneous BID WC  . lactulose  10 g Oral TID  . pantoprazole  20 mg Oral QHS  .  propranolol  10 mg Oral BID  . rifaximin  550 mg Oral BID  . rosuvastatin  10 mg Oral QPM  . sodium chloride  3 mL Intravenous Q12H  . venlafaxine  75 mg Oral BID   Continuous Infusions:   Active Problems:   Obstructive sleep apnea   Diabetes mellitus   HTN (hypertension)   Hypoxia   Debility   Non-alcoholic cirrhosis   Esophageal varices   Acute on chronic renal failure    Time spent: 40 minutes.    Vernell Leep, MD, FACP, FHM. [facility] Pager [phone]  If 7PM-7AM, please contact night-coverage [email or web address] Password [email or web address] 02/16/2014, 12:49 PM    LOS: 1 day

## 2014-02-16 NOTE — Progress Notes (Signed)
OT Cancellation Note  Patient Details Name: [name] MRN: [id number] DOB: [birth year]   Cancelled Treatment:    Reason Eval/Treat Not Completed: Other (comment) (pt eating breakfast) - will reattempt later.   Darlina Rumpf Dale, OTR/L [phone]  02/16/2014, 11:31 AM

## 2014-02-17 DIAGNOSIS — N183 Chronic kidney disease, stage 3 (moderate): Secondary | ICD-10-CM

## 2014-02-17 DIAGNOSIS — J9621 Acute and chronic respiratory failure with hypoxia: Secondary | ICD-10-CM

## 2014-02-17 LAB — COMPREHENSIVE METABOLIC PANEL
ALT: 21 U/L (ref 0–53)
ANION GAP: 6 (ref 5–15)
AST: 64 U/L — AB (ref 0–37)
Albumin: 1.8 g/dL — ABNORMAL LOW (ref 3.5–5.2)
Alkaline Phosphatase: 106 U/L (ref 39–117)
BUN: 16 mg/dL (ref 6–23)
CALCIUM: 8.5 mg/dL (ref 8.4–10.5)
CHLORIDE: 103 mmol/L (ref 96–112)
CO2: 31 mmol/L (ref 19–32)
Creatinine, Ser: 1.45 mg/dL — ABNORMAL HIGH (ref 0.50–1.35)
GFR calc Af Amer: 52 mL/min — ABNORMAL LOW (ref >90)
GFR calc non Af Amer: 45 mL/min — ABNORMAL LOW (ref >90)
Glucose, Bld: 77 mg/dL (ref 70–99)
Potassium: 4.2 mmol/L (ref 3.5–5.1)
Sodium: 140 mmol/L (ref 135–145)
Total Bilirubin: 2.1 mg/dL — ABNORMAL HIGH (ref 0.3–1.2)
Total Protein: 6.1 g/dL (ref 6.0–8.3)

## 2014-02-17 LAB — GLUCOSE, CAPILLARY
GLUCOSE-CAPILLARY: 153 mg/dL — AB (ref 70–99)
Glucose-Capillary: 89 mg/dL (ref 70–99)
Glucose-Capillary: 128 mg/dL — ABNORMAL HIGH (ref 70–99)
Glucose-Capillary: 104 mg/dL — ABNORMAL HIGH (ref 70–99)

## 2014-02-17 LAB — CBC
HEMATOCRIT: 32.1 % — AB (ref 39.0–52.0)
Hemoglobin: 10.9 g/dL — ABNORMAL LOW (ref 13.0–17.0)
MCH: 32.9 pg (ref 26.0–34.0)
MCHC: 34 g/dL (ref 30.0–36.0)
MCV: 97 fL (ref 78.0–100.0)
PLATELETS: 53 10*3/uL — AB (ref 150–400)
RBC: 3.31 MIL/uL — ABNORMAL LOW (ref 4.22–5.81)
RDW: 15.7 % — ABNORMAL HIGH (ref 11.5–15.5)
WBC: 3.6 10*3/uL — AB (ref 4.0–10.5)

## 2014-02-17 LAB — PREALBUMIN: PREALBUMIN: 6.8 mg/dL — AB (ref 17.0–34.0)

## 2014-02-17 NOTE — Progress Notes (Signed)
Assisted pt. With placing himself on his home CPAP. CPAP is set at 14cm H2O & pt. Has his nasal mask from home. Pt. Is tolerating CPAP well at this time without any complications.

## 2014-02-17 NOTE — Progress Notes (Addendum)
PROGRESS NOTE    MIRIAM KESTLER YHC:[id number] DOB: 11-26-1935 DOA: 02/15/2014 PCP: Shirline Frees, MD  Primary Gastroenterologist: Dr. Ronald Lobo Primary Endocrinologist: Dr. Delrae Rend Primary Hepatologist: At [facility]  HPI/Brief narrative  79 year old male with history of NASH Cirrhosis-followed at [facility], hepatic encephalopathy, varices, DM 2, CKD, HTN, GERD, depression and anxiety, sleep apnea, treated found to be hypoxic in the 60s during routine PCP visit, increased lower extremity edema, 12 pound weight gain, elevated creatinine and associated weakness and difficulty ambulating. No reported confusion.   Assessment/Plan:  1. Fluid overload/anasarca: Secondary to liver disease and hypo-albuminemia. Nephrology input appreciated: have started IV Lasix 80 mg bid on 02/16/14. Continue amiloride. Diuresing > - 1.8 L since admission. Weight not helpful; 101 Kg> 103 since admission but down 1 lb since yesterday. Creatinine seems to be at baseline. 2. Acute hypoxic respiratory failure: DD-pulmonary edema related to fluid overload from cirrhosis, atelectasis. Mild abdominal distention probably not contributing much. No significant hypoxia in ED. Low index of suspicion for PE. EF normal 09/22/12.  Check desaturation eval. 3. Stage III chronic kidney disease: Baseline creatinine 1.3 and currently 13-1.4. Follow BMP. Per Nephrology. 4. NASH cirrhosis/esophageal varices hepatic encephalopathy history: Follows at [facility]. No significant ascites. Does have peripheral edema. Continue diuretics, propranolol, lactulose and rifaximin. Aabdominal ultrasound: limited study, Cirrhosis, ascites and splenomegaly. 5. Essential hypertension: Controlled. 6. OSA: Continue nightly C Pap qHS. 7. DM 2: Place on reduced dose of 70/30 insulin and SSI. Controlled. 8. GERD: PPI 9. Pancytopenia: Chronic and seems stable. Likely related to cirrhosis. Follow CBCs   Code Status: Full Family Communication: discussed with  Spouse. Disposition Plan: Home when medically stable.   Consultants:  Nephrology  Procedures:  None  Antibiotics:  None   Subjective: Denies complaints. No SOB. 12 pound weight gain over the last 3 weeks pta. Leg edema. Cant tell if urinating more> has condom cath.  Objective: Filed Vitals:   02/16/14 1831 02/16/14 2200 02/17/14 0023 02/17/14 0535  BP: 112/51 127/45 122/80 108/53  Pulse: 65 61 59 57  Temp: 98.4 F (36.9 C) 98.5 F (36.9 C) 98.3 F (36.8 C) 98 F (36.7 C)  TempSrc: Oral Oral Oral Oral  Resp: 20     Height:      Weight:    103.3 kg (227 lb 11.8 oz)  SpO2: 97% 95% 98% 96%    Intake/Output Summary (Last 24 hours) at 02/17/14 0905 Last data filed at 02/17/14 0500  Gross per 24 hour  Intake    440 ml  Output   1800 ml  Net  -1360 ml   Filed Weights   02/15/14 1542 02/16/14 0201 02/17/14 0535  Weight: 101.152 kg (223 lb) 104.2 kg (229 lb 11.5 oz) 103.3 kg (227 lb 11.8 oz)     Exam:  General exam: Pleasant elderly male lying comfortably supine in bed. Respiratory system: Clear. No increased work of breathing. Cardiovascular system: S1 & S2 heard, RRR. No JVD, murmurs, gallops, clicks. 1+ pitting bilateral leg edema. Gastrointestinal system: Abdomen is mildly distended, soft and nontender. Normal bowel sounds heard. No significant clinical ascites. Sacral edema+ Central nervous system: Alert and oriented. No focal neurological deficits. Extremities: Symmetric 5 x 5 power.   Data Reviewed: Basic Metabolic Panel:  Recent Labs Lab 02/15/14 1607 02/16/14 0856 02/17/14 0551  NA 141 142 140  K 4.0 3.7 4.2  CL 103 104 103  CO2 32 32 31  GLUCOSE 174* 96 77  BUN 15 15 16   CREATININE  1.42* 1.38* 1.45*  CALCIUM 8.4 8.1* 8.5  MG  --  2.0  --   PHOS  --  3.7  --    Liver Function Tests:  Recent Labs Lab 02/15/14 1607 02/16/14 0856 02/17/14 0551  AST 74* 64* 64*  ALT 23 23 21   ALKPHOS 114 102 106  BILITOT 2.6* 2.6* 2.1*  PROT 6.5 6.3  6.1  ALBUMIN 2.0* 1.8* 1.8*   No results for input(s): LIPASE, AMYLASE in the last 168 hours.  Recent Labs Lab 02/15/14 1607  AMMONIA 63*   CBC:  Recent Labs Lab 02/15/14 1607 02/16/14 0856 02/17/14 0551  WBC 4.2 3.1* 3.6*  HGB 11.9* 10.8* 10.9*  HCT 35.4* 32.0* 32.1*  MCV 99.2 97.6 97.0  PLT 81* 54* 53*   Cardiac Enzymes:  Recent Labs Lab 02/15/14 1607  TROPONINI 0.03   BNP (last 3 results) No results for input(s): PROBNP in the last 8760 hours. CBG:  Recent Labs Lab 02/16/14 0838 02/16/14 1133 02/16/14 1621 02/16/14 2133 02/17/14 0740  GLUCAP 89 140* 113* 88 89    No results found for this or any previous visit (from the past 240 hour(s)).         Studies: Dg Chest 2 View  02/16/2014   CLINICAL DATA:  Hypoxia, confusion  EXAM: CHEST  2 VIEW  COMPARISON:  02/15/2014  FINDINGS: Cardiomegaly again noted. Central mild vascular congestion. Patchy bilateral basilar atelectasis or infiltrate with slight worsening in aeration from prior exam.  IMPRESSION: Central mild vascular congestion. Patchy bilateral basilar atelectasis or infiltrate with slight worsening and are aeration.   Electronically Signed   By: Lahoma Crocker M.D.   On: 02/16/2014 08:14   Dg Chest 2 View  02/15/2014   CLINICAL DATA:  Two weeks of shortness breath weakness and fatigue semi: History of diabetes and renal insufficiency  EXAM: CHEST  2 VIEW  COMPARISON:  Portable chest x-ray of September 21, 2012  FINDINGS: The lungs are mildly hypoinflated. There is mild linear density at the right lung base. The cardiac silhouette is enlarged. The pulmonary vascularity is mildly prominent centrally but some vascular crowding is occurring. There is no pleural effusion. The bony thorax exhibits no acute abnormality.  IMPRESSION: Bilateral pulmonary hypoinflation. There is right basilar atelectasis or early pneumonia. A follow-up chest x-ray with straight AP positioning or better yet a PA and lateral study would be  useful.   Electronically Signed   By: David  Martinique   On: 02/15/2014 17:32   US Abdomen Complete  02/16/2014   CLINICAL DATA:  Cirrhosis secondary to NASH, history hypertension, diabetes, acute on chronic renal failure, esophageal varices, GERD  EXAM: ULTRASOUND ABDOMEN COMPLETE  COMPARISON:  09/06/2013  FINDINGS: Gallbladder: Small amount of sludge within gallbladder. Upper normal gallbladder wall thickness, nonspecific in setting of ascites. No shadowing calculi or sonographic Murphy sign.  Common bile duct: Diameter: Normal caliber 3 mm diameter  Liver: Heterogeneous minimally coarsened echogenicity with nodular hepatic margins consistent with cirrhosis. Hepatopetal portal venous flow. No definite focal mass identified though portions of the LEFT lobe are inadequately visualized due to shadowing from bowel gas.  IVC: No abnormality visualized.  Pancreas: Visualized portion unremarkable.  Spleen: Incompletely visualized due to body habitus and bowel gas. Morphologically demonstrates rounded margins and suspect is enlarged though unable to accurately measure. No visualized focal mass lesion.  Right Kidney: Length: 10.5 cm. Cortical thinning. Upper normal cortical echogenicity. No mass or hydronephrosis.  Left Kidney: Length: 10.4 cm. Age-related cortical  thinning. Upper normal echogenicity. No mass or hydronephrosis.  Abdominal aorta: Obscured by bowel gas  Other findings: Ascites in upper abdomen.  IMPRESSION: Limited exam demonstrating cirrhotic liver, ascites and probable splenomegaly though spleen is incompletely visualized.  No additional abnormalities identified by limited sonographic assessment.  If better intrahepatic visualization is required such S for hepatic cellular carcinoma screening, consider MR imaging with and without contrast.   Electronically Signed   By: Lavonia Dana M.D.   On: 02/16/2014 13:11        Scheduled Meds: . aMILoride  5 mg Oral Daily  . darifenacin  15 mg Oral Daily  .  ferrous sulfate  325 mg Oral QHS  . furosemide  80 mg Intravenous BID  . guaiFENesin  600 mg Oral BID  . insulin aspart  0-5 Units Subcutaneous QHS  . insulin aspart  0-9 Units Subcutaneous TID WC  . insulin aspart protamine- aspart  25 Units Subcutaneous BID WC  . lactulose  10 g Oral TID  . pantoprazole  20 mg Oral QHS  . propranolol  10 mg Oral BID  . rifaximin  550 mg Oral BID  . rosuvastatin  10 mg Oral QPM  . sodium chloride  3 mL Intravenous Q12H  . venlafaxine  75 mg Oral BID   Continuous Infusions:   Active Problems:   Obstructive sleep apnea   Diabetes mellitus   HTN (hypertension)   Hypoxia   Debility   Non-alcoholic cirrhosis   Esophageal varices   Acute on chronic renal failure    Time spent: 30 minutes.    Vernell Leep, MD, FACP, FHM. [facility] Pager [phone]  If 7PM-7AM, please contact night-coverage [email or web address] Password [email or web address] 02/17/2014, 9:05 AM    LOS: 2 days

## 2014-02-17 NOTE — Progress Notes (Signed)
Patient ID: [name], male    DOB: 05-28-1935, 79 y.o.   MRN: [id number] Nephrology Progress Note  S:Sitting up in bed eating breakfast   O:BP 108/53 mmHg  Pulse 57  Temp(Src) 98 F (36.7 C) (Oral)  Resp 20  Ht 5\' 7"  (1.702 m)  Wt 227 lb 11.8 oz (103.3 kg)  BMI 35.66 kg/m2  SpO2 96%  Intake/Output Summary (Last 24 hours) at 02/17/14 0929 Last data filed at 02/17/14 0500  Gross per 24 hour  Intake    440 ml  Output   1800 ml  Net  -1360 ml   Intake/Output: I/O last 3 completed shifts: In: 440 [P.O.:440] Out: 2300 [Urine:2300]  Intake/Output this shift:    Weight change: 4 lb 11.8 oz (2.148 kg) GLO:VFIEP and awake, sitting up in bed.  PIR:JJOACZYSAYT, S1S2,  Resp:CTAB KZS:WFUXNA distended, NT, soft, +BS  Ext: +1 pitting b/l leg edema.    Recent Labs Lab 02/15/14 1607 02/16/14 0856 02/17/14 0551  NA 141 142 140  K 4.0 3.7 4.2  CL 103 104 103  CO2 32 32 31  GLUCOSE 174* 96 77  BUN 15 15 16   CREATININE 1.42* 1.38* 1.45*  ALBUMIN 2.0* 1.8* 1.8*  CALCIUM 8.4 8.1* 8.5  PHOS  --  3.7  --   AST 74* 64* 64*  ALT 23 23 21    Liver Function Tests:  Recent Labs Lab 02/15/14 1607 02/16/14 0856 02/17/14 0551  AST 74* 64* 64*  ALT 23 23 21   ALKPHOS 114 102 106  BILITOT 2.6* 2.6* 2.1*  PROT 6.5 6.3 6.1  ALBUMIN 2.0* 1.8* 1.8*   No results for input(s): LIPASE, AMYLASE in the last 168 hours.  Recent Labs Lab 02/15/14 1607  AMMONIA 63*   CBC:  Recent Labs Lab 02/15/14 1607 02/16/14 0856 02/17/14 0551  WBC 4.2 3.1* 3.6*  HGB 11.9* 10.8* 10.9*  HCT 35.4* 32.0* 32.1*  MCV 99.2 97.6 97.0  PLT 81* 54* 53*   Cardiac Enzymes:  Recent Labs Lab 02/15/14 1607  TROPONINI 0.03   CBG:  Recent Labs Lab 02/16/14 0838 02/16/14 1133 02/16/14 1621 02/16/14 2133 02/17/14 0740  GLUCAP 89 140* 113* 88 89    Iron Studies: No results for input(s): IRON, TIBC, TRANSFERRIN, FERRITIN in the last 72 hours. Studies/Results: Dg Chest 2 View  02/16/2014    CLINICAL DATA:  Hypoxia, confusion  EXAM: CHEST  2 VIEW  COMPARISON:  02/15/2014  FINDINGS: Cardiomegaly again noted. Central mild vascular congestion. Patchy bilateral basilar atelectasis or infiltrate with slight worsening in aeration from prior exam.  IMPRESSION: Central mild vascular congestion. Patchy bilateral basilar atelectasis or infiltrate with slight worsening and are aeration.   Electronically Signed   By: Lahoma Crocker M.D.   On: 02/16/2014 08:14   Dg Chest 2 View  02/15/2014   CLINICAL DATA:  Two weeks of shortness breath weakness and fatigue semi: History of diabetes and renal insufficiency  EXAM: CHEST  2 VIEW  COMPARISON:  Portable chest x-ray of September 21, 2012  FINDINGS: The lungs are mildly hypoinflated. There is mild linear density at the right lung base. The cardiac silhouette is enlarged. The pulmonary vascularity is mildly prominent centrally but some vascular crowding is occurring. There is no pleural effusion. The bony thorax exhibits no acute abnormality.  IMPRESSION: Bilateral pulmonary hypoinflation. There is right basilar atelectasis or early pneumonia. A follow-up chest x-ray with straight AP positioning or better yet a PA and lateral study would be useful.  Electronically Signed   By: David  [place]   On: 02/15/2014 17:32   US Abdomen Complete  02/16/2014   CLINICAL DATA:  Cirrhosis secondary to NASH, history hypertension, diabetes, acute on chronic renal failure, esophageal varices, GERD  EXAM: ULTRASOUND ABDOMEN COMPLETE  COMPARISON:  09/06/2013  FINDINGS: Gallbladder: Small amount of sludge within gallbladder. Upper normal gallbladder wall thickness, nonspecific in setting of ascites. No shadowing calculi or sonographic Murphy sign.  Common bile duct: Diameter: Normal caliber 3 mm diameter  Liver: Heterogeneous minimally coarsened echogenicity with nodular hepatic margins consistent with cirrhosis. Hepatopetal portal venous flow. No definite focal mass identified though  portions of the LEFT lobe are inadequately visualized due to shadowing from bowel gas.  IVC: No abnormality visualized.  Pancreas: Visualized portion unremarkable.  Spleen: Incompletely visualized due to body habitus and bowel gas. Morphologically demonstrates rounded margins and suspect is enlarged though unable to accurately measure. No visualized focal mass lesion.  Right Kidney: Length: 10.5 cm. Cortical thinning. Upper normal cortical echogenicity. No mass or hydronephrosis.  Left Kidney: Length: 10.4 cm. Age-related cortical thinning. Upper normal echogenicity. No mass or hydronephrosis.  Abdominal aorta: Obscured by bowel gas  Other findings: Ascites in upper abdomen.  IMPRESSION: Limited exam demonstrating cirrhotic liver, ascites and probable splenomegaly though spleen is incompletely visualized.  No additional abnormalities identified by limited sonographic assessment.  If better intrahepatic visualization is required such S for hepatic cellular carcinoma screening, consider MR imaging with and without contrast.   Electronically Signed   By: Lavonia Dana M.D.   On: 02/16/2014 13:11   . aMILoride  5 mg Oral Daily  . darifenacin  15 mg Oral Daily  . ferrous sulfate  325 mg Oral QHS  . furosemide  80 mg Intravenous BID  . guaiFENesin  600 mg Oral BID  . insulin aspart  0-5 Units Subcutaneous QHS  . insulin aspart  0-9 Units Subcutaneous TID WC  . insulin aspart protamine- aspart  25 Units Subcutaneous BID WC  . lactulose  10 g Oral TID  . pantoprazole  20 mg Oral QHS  . propranolol  10 mg Oral BID  . rifaximin  550 mg Oral BID  . rosuvastatin  10 mg Oral QPM  . sodium chloride  3 mL Intravenous Q12H  . venlafaxine  75 mg Oral BID    BMET    Component Value Date/Time   NA 140 02/17/2014 0551   K 4.2 02/17/2014 0551   CL 103 02/17/2014 0551   CO2 31 02/17/2014 0551   GLUCOSE 77 02/17/2014 0551   BUN 16 02/17/2014 0551   CREATININE 1.45* 02/17/2014 0551   CALCIUM 8.5 02/17/2014 0551    GFRNONAA 45* 02/17/2014 0551   GFRAA 52* 02/17/2014 0551   CBC    Component Value Date/Time   WBC 3.6* 02/17/2014 0551   RBC 3.31* 02/17/2014 0551   HGB 10.9* 02/17/2014 0551   HCT 32.1* 02/17/2014 0551   PLT 53* 02/17/2014 0551   MCV 97.0 02/17/2014 0551   MCH 32.9 02/17/2014 0551   MCHC 34.0 02/17/2014 0551   RDW 15.7* 02/17/2014 0551   LYMPHSABS 1.4 09/21/2012 1705   MONOABS 0.7 09/21/2012 1705   EOSABS 0.1 09/21/2012 1705   BASOSABS 0.0 09/21/2012 1705    Assessment/Plan: Orion Modest 79 y.o. male with PMH NASH (nonalcoholic steatohepatitis); Diabetes mellitus without complication; Kidney disease; Hypertension; GERD (gastroesophageal reflux disease); Depression; Anxiety; Sleep apnea; Anemia; and Esophageal varices. ; admitted 02/15/2014 for volume overload.  1. CKD 3. Slight bump in the Scr with the addition of increased lasix but good response with UOP. Scr at baseline. Consider one more day of 80 mg IV lasix BID 2. Fluid overload/anasarca: related to hypo-albuminemia from cirrhosis. Fluid restriction 1200 mL  3. Acute hypoxic resp failure: resolved  4. NASH cirrhosis/esophageal varices hepatic encephalopathy history - supportive care 5. HTN 6. OSA 7. DM2 8. GERD  9. Pancytopenia: chronic and stable.    Clearance Coots E 9:29 AM I have seen and examined this patient and agree with plan per Dr Raeford Razor.  Made fair amt of urine yest.  Renal fx stable.  Cont IV lasix.  Recheck labs in AM. Satoya Feeley T,MD 02/17/2014 10:09 AM

## 2014-02-17 NOTE — Progress Notes (Signed)
Occupational Therapy Treatment Patient Details Name: [name] MRN: [id number] DOB: Nov 19, 1935 Today's Date: 02/17/2014    History of present illness This 79 y.o male with h/o NASH (nonalcoholic steatohepatitis) admitted with hypoxemia and worsening LE edema, increased weakness.  DX: CKD stage III; ARF.  PMH includes:   HTN, NASH, OSA, DM, GERD, Pantocytopenia     OT comments  Pt appeared a bit more confused today, and with several Losses of balance requiring assist to recover - unsure if fatigue could be impacting.  He will need 24 hour assist at discharge.  Wife continues to state she can manage him.    Follow Up Recommendations  Home health OT;Supervision/Assistance - 24 hour;Other (comment) (HHaide)    Equipment Recommendations  3 in 1 bedside comode;Wheelchair (measurements OT);Other (comment) (transport wheelchair )    Recommendations for Other Services      Precautions / Restrictions Precautions Precautions: Fall Precaution Comments: wife reports falls at home        Mobility Bed Mobility Overal bed mobility: Needs Assistance Bed Mobility: Supine to Sit;Sit to Supine     Supine to sit: Supervision Sit to supine: Supervision      Transfers Overall transfer level: Needs assistance Equipment used: 1 person hand held assist Transfers: Sit to/from Stand;Stand Pivot Transfers Sit to Stand: Min assist Stand pivot transfers: Min assist       General transfer comment: min A to steady     Balance Overall balance assessment: Needs assistance Sitting-balance support: Feet supported Sitting balance-Leahy Scale: Good     Standing balance support: During functional activity Standing balance-Leahy Scale: Poor Standing balance comment: Pt with several losses of balance today when he became distracted.  required mod assist to recover                    ADL Overall ADL's : Needs assistance/impaired Eating/Feeding: Independent;Sitting   Grooming: Wash/dry  hands;Wash/dry face;Oral care;Minimal assistance;Standing                   Toilet Transfer: Minimal assistance;Ambulation   Toileting- Clothing Manipulation and Hygiene: Minimal assistance;Sit to/from stand       Functional mobility during ADLs: Minimal assistance General ADL Comments: worked on balance activities in [place] During Therapy: [facility] for tasks assessed/performed Overall Cognitive Status: Impaired/Different from baseline Area of Impairment: Attention;Memory;Following commands;Safety/judgement;Awareness;Problem solving   Current Attention Level: Selective Memory: Decreased short-term memory  Following Commands: Follows one step commands consistently Safety/Judgement: Decreased awareness of safety;Decreased awareness of deficits Awareness: Intellectual Problem Solving: Slow processing;Decreased initiation;Difficulty sequencing;Requires verbal cues;Requires tactile cues General Comments: Pt requires min cues to complete simple grooming today.  Demonstrates increased difficulty participating in conversation compared to yesterday     Extremity/Trunk Assessment               Exercises     Shoulder Instructions       General Comments      Pertinent Vitals/ Pain       Pain Assessment: No/denies pain  Home Living  Prior Functioning/Environment              Frequency Min 2X/week     Progress Toward Goals  OT Goals(current goals can now be found in the care plan section)     ADL Goals Pt Will Perform Grooming: with supervision;standing Pt Will Perform Lower Body Bathing: with supervision;sit to/from stand Pt Will Perform Lower Body Dressing: with supervision;sit to/from stand Pt Will Transfer to Toilet: with supervision;ambulating;regular height toilet;bedside commode;grab bars Pt Will  Perform Toileting - Clothing Manipulation and hygiene: with supervision;sit to/from stand Pt Will Perform Tub/Shower Transfer: Shower transfer;with supervision;grab bars;rolling walker;shower seat;ambulating Additional ADL Goal #1: Pt will participate in 25 mins therapeutic activities with no more than 2 rest breaks   Plan Discharge plan remains appropriate    Co-evaluation                 End of Session Equipment Utilized During Treatment: Gait belt   Activity Tolerance Patient tolerated treatment well   Patient Left in bed;with call bell/phone within reach;with bed alarm set;with family/visitor present   Nurse Communication Mobility status        Time: 6629-4765 OT Time Calculation (min): 39 min  Charges: OT General Charges $OT Visit: 1 Procedure OT Treatments $Self Care/Home Management : 8-22 mins $Therapeutic Activity: 23-37 mins  Margarett Viti M 02/17/2014, 6:13 PM

## 2014-02-18 DIAGNOSIS — K7460 Unspecified cirrhosis of liver: Secondary | ICD-10-CM | POA: Insufficient documentation

## 2014-02-18 DIAGNOSIS — K7581 Nonalcoholic steatohepatitis (NASH): Secondary | ICD-10-CM

## 2014-02-18 DIAGNOSIS — N189 Chronic kidney disease, unspecified: Secondary | ICD-10-CM | POA: Insufficient documentation

## 2014-02-18 DIAGNOSIS — D696 Thrombocytopenia, unspecified: Secondary | ICD-10-CM | POA: Insufficient documentation

## 2014-02-18 LAB — CBC
HEMATOCRIT: 32.2 % — AB (ref 39.0–52.0)
Hemoglobin: 10.9 g/dL — ABNORMAL LOW (ref 13.0–17.0)
MCH: 32.8 pg (ref 26.0–34.0)
MCHC: 33.9 g/dL (ref 30.0–36.0)
MCV: 97 fL (ref 78.0–100.0)
Platelets: 56 10*3/uL — ABNORMAL LOW (ref 150–400)
RBC: 3.32 MIL/uL — ABNORMAL LOW (ref 4.22–5.81)
RDW: 15.7 % — ABNORMAL HIGH (ref 11.5–15.5)
WBC: 3.9 10*3/uL — ABNORMAL LOW (ref 4.0–10.5)

## 2014-02-18 LAB — RENAL FUNCTION PANEL
ALBUMIN: 1.8 g/dL — AB (ref 3.5–5.2)
Anion gap: 8 (ref 5–15)
BUN: 16 mg/dL (ref 6–23)
CALCIUM: 8 mg/dL — AB (ref 8.4–10.5)
CHLORIDE: 99 mmol/L (ref 96–112)
CO2: 29 mmol/L (ref 19–32)
CREATININE: 1.34 mg/dL (ref 0.50–1.35)
GFR calc Af Amer: 58 mL/min — ABNORMAL LOW (ref >90)
GFR calc non Af Amer: 50 mL/min — ABNORMAL LOW (ref >90)
GLUCOSE: 87 mg/dL (ref 70–99)
Phosphorus: 3.8 mg/dL (ref 2.3–4.6)
Potassium: 3.9 mmol/L (ref 3.5–5.1)
SODIUM: 136 mmol/L (ref 135–145)

## 2014-02-18 LAB — GLUCOSE, CAPILLARY
GLUCOSE-CAPILLARY: 80 mg/dL (ref 70–99)
GLUCOSE-CAPILLARY: 160 mg/dL — AB (ref 70–99)
Glucose-Capillary: 92 mg/dL (ref 70–99)

## 2014-02-18 MED ORDER — LACTULOSE 10 GM/15ML PO SOLN: 20.0000 g | Freq: Three times a day (TID) | ORAL | Status: DC

## 2014-02-18 MED ORDER — LACTULOSE 10 GM/15ML PO SOLN
20.0000 g | Freq: Three times a day (TID) | ORAL | Status: DC
  Administered 2014-02-18: 20 g via ORAL
  Filled 2014-02-18 (×3): qty 30

## 2014-02-18 MED ORDER — FUROSEMIDE 40 MG PO TABS
40.0000 mg | ORAL_TABLET | Freq: Two times a day (BID) | ORAL | Status: DC
  Filled 2014-02-18 (×3): qty 1

## 2014-02-18 NOTE — Discharge Summary (Signed)
Physician Discharge Summary  [name] TIR:[id number] DOB: 08-Dec-1935 DOA: 02/15/2014  PCP: Shirline Frees, MD  Admit date: 02/15/2014 Discharge date: 02/18/2014  Recommendations for Outpatient Follow-up:  1. Pt will need to follow up with PCP in 1 week post discharge 2. Please obtain BMP in one week  Discharge Diagnoses:  1. Fluid overload/anasarca: Secondary to liver disease and hypo-albuminemia. Nephrology was consulted.  Nephrology input appreciated: started IV Lasix 80 mg bid IV on 02/16/14. Continue amiloride. Diuresing > - 2.7 L since admission. Weight not helpful; 101 Kg> 103 since admission but down 1 lb since yesterday. Creatinine seems to be at baseline. The patient had a good clinical response. The case was discussed with nephrology, Dr. Mercy Moore on the day of discharge. He felt that the patient was clinically stable from a renal standpoint for discharge. Dr. Mercy Moore recommended restarting the patient's previous dose of furosemide, 40 mg  po twice a day.  the patient was instructed to weigh himself on a daily basis. He was instructed to contact his primary care physician if he gains more than 2 pounds on successive days or more than 5 pounds over 3 days 2. Acute hypoxic respiratory failure: DD-pulmonary edema related to fluid overload from cirrhosis, atelectasis. Mild abdominal distention probably not contributing much. No significant hypoxia in ED. Low index of suspicion for PE. EF normal 09/22/12. The patient was weaned off of supplemental oxygen as he was diuresed. The patient ambulate with physical therapy and did not have any oxygen desaturation. He was stable for discharge on room air.  3. Stage III chronic kidney disease: Baseline creatinine 1.3 and currently 13-1.4. Follow BMP. Serum creatinine 1.34 on the day of discharge  4. NASH cirrhosis/esophageal varices hepatic encephalopathy history: Follows at [facility]. No significant ascites. Does have peripheral edema. Continue  diuretics, propranolol, lactulose and rifaximin. Abdominal ultrasound: limited study, Cirrhosis, minimum ascites and splenomegaly.The patient needs to follow-up with his local gastroenterologist, Dr. Cristina Gong.    5. Essential hypertension: Controlled. 6. OSA: Continue nightly CPap qHS. 7. DM 2: Place on reduced dose of 70/30 insulin and SSI. Controlled. at the time of discharge, he will return back to his usual dose of insulin at home. 8. GERD: PPI Pancytopenia: Chronic and seems stable. Likely related to cirrhosis. Follow CBCs Deconditioning  -The patient was evaluated by physical therapy and occupational therapy--they recommended home health physical therapy and occupational therapy which was set up with the assistance of case management   Discharge Condition: stable  Disposition: home  Diet:  Low sodium Wt Readings from Last 3 Encounters:  02/17/14 101.8 kg (224 lb 6.9 oz)  11/05/12 102.513 kg (226 lb)  09/23/12 101.9 kg (224 lb 10.4 oz)    History of present illness:  79 year old male with history of NASH Cirrhosis-followed at [facility], hepatic encephalopathy, varices, DM 2, CKD, HTN, GERD, depression and anxiety, sleep apnea, treated found to be hypoxic in the 60s during routine PCP visit, increased lower extremity edema, 12 pound weight gain, elevated creatinine and associated weakness and difficulty ambulating. No reported confusion. The patient was started on intravenous furosemide after admission. The patient had good clinical response. He was weaned off of oxygen. The patient presented with physical therapy and occupational therapy without oxygen desaturation. Nephrology was consulted and assisted with diuretic management. On the day of discharge, the case was discussed with Dr. Mercy Moore who cleared the patient for discharge. He will go home with furosemide 40 mg by mouth twice a day.   Consultants: nephrology  Discharge Exam: Filed Vitals:   02/18/14 0911  BP: 108/44  Pulse: 61    Temp: 97.8 F (36.6 C)  Resp: 20   Filed Vitals:   02/17/14 1830 02/17/14 2008 02/17/14 2120 02/18/14 0911  BP: 98/55 111/51  108/44  Pulse: 61 60 60 61  Temp: 98 F (36.7 C) 99.2 F (37.3 C)  97.8 F (36.6 C)  TempSrc: Oral   Oral  Resp: 20 17 16 20   Height:      Weight:  101.8 kg (224 lb 6.9 oz)    SpO2: 96% 95% 95% 95%   General: A&O x 3, NAD, pleasant, cooperative Cardiovascular: RRR, no rub, no gallop, no S3 Respiratory: CTAB, no wheeze, no rhonchi Abdomen:soft, nontender, nondistended, positive bowel sounds Extremities: 1+LE edema, No lymphangitis, no petechiae  Discharge Instructions      Discharge Instructions    Diet - low sodium heart healthy    Complete by:  As directed      Increase activity slowly    Complete by:  As directed             Medication List    STOP taking these medications        pantoprazole 20 MG tablet  Commonly known as:  PROTONIX      TAKE these medications        aMILoride 5 MG tablet  Commonly known as:  MIDAMOR  Take 5 mg by mouth daily.     cholecalciferol 1000 UNITS tablet  Commonly known as:  VITAMIN D  Take 1,000 Units by mouth every morning.     clotrimazole-betamethasone cream  Commonly known as:  LOTRISONE  Apply 1 application topically 2 (two) times daily.     ferrous sulfate 325 (65 FE) MG tablet  Take 325 mg by mouth at bedtime.     furosemide 40 MG tablet  Commonly known as:  LASIX  Take 40 mg by mouth 2 (two) times daily.     insulin aspart protamine- aspart (70-30) 100 UNIT/ML injection  Commonly known as:  NOVOLOG MIX 70/30  Inject 30 Units into the skin 2 (two) times daily with a meal.     ketoconazole 2 % shampoo  Commonly known as:  NIZORAL  Apply 1 application topically 2 (two) times a week.     lactulose 10 GM/15ML solution  Commonly known as:  CHRONULAC  Take 30 mLs (20 g total) by mouth 3 (three) times daily.     multivitamin with minerals Tabs tablet  Take 1 tablet by mouth daily.      omeprazole 20 MG capsule  Commonly known as:  PRILOSEC  Take 20 mg by mouth daily.     PRESCRIPTION MEDICATION  Apply 1 application topically daily as needed. Eucerin/Triamcinolone 0.1% apply for dry skin     PROBIOTIC DAILY Caps  Take 2 capsules by mouth every morning.     propranolol 10 MG tablet  Commonly known as:  INDERAL  Take 10 mg by mouth 2 (two) times daily.     rifaximin 550 MG Tabs tablet  Commonly known as:  XIFAXAN  Take 550 mg by mouth 2 (two) times daily.     rosuvastatin 20 MG tablet  Commonly known as:  CRESTOR  Take 10 mg by mouth every evening.     solifenacin 10 MG tablet  Commonly known as:  VESICARE  Take 10 mg by mouth daily.     venlafaxine 75 MG tablet  Commonly known as:  EFFEXOR  Take 75 mg by mouth 2 (two) times daily.         The results of significant diagnostics from this hospitalization (including imaging, microbiology, ancillary and laboratory) are listed below for reference.    Significant Diagnostic Studies: Dg Chest 2 View  02/16/2014   CLINICAL DATA:  Hypoxia, confusion  EXAM: CHEST  2 VIEW  COMPARISON:  02/15/2014  FINDINGS: Cardiomegaly again noted. Central mild vascular congestion. Patchy bilateral basilar atelectasis or infiltrate with slight worsening in aeration from prior exam.  IMPRESSION: Central mild vascular congestion. Patchy bilateral basilar atelectasis or infiltrate with slight worsening and are aeration.   Electronically Signed   By: Lahoma Crocker M.D.   On: 02/16/2014 08:14   Dg Chest 2 View  02/15/2014   CLINICAL DATA:  Two weeks of shortness breath weakness and fatigue semi: History of diabetes and renal insufficiency  EXAM: CHEST  2 VIEW  COMPARISON:  Portable chest x-ray of September 21, 2012  FINDINGS: The lungs are mildly hypoinflated. There is mild linear density at the right lung base. The cardiac silhouette is enlarged. The pulmonary vascularity is mildly prominent centrally but some vascular crowding is  occurring. There is no pleural effusion. The bony thorax exhibits no acute abnormality.  IMPRESSION: Bilateral pulmonary hypoinflation. There is right basilar atelectasis or early pneumonia. A follow-up chest x-ray with straight AP positioning or better yet a PA and lateral study would be useful.   Electronically Signed   By: Zamari Bonsall  [place]   On: 02/15/2014 17:32   US Abdomen Complete  02/16/2014   CLINICAL DATA:  Cirrhosis secondary to NASH, history hypertension, diabetes, acute on chronic renal failure, esophageal varices, GERD  EXAM: ULTRASOUND ABDOMEN COMPLETE  COMPARISON:  09/06/2013  FINDINGS: Gallbladder: Small amount of sludge within gallbladder. Upper normal gallbladder wall thickness, nonspecific in setting of ascites. No shadowing calculi or sonographic Murphy sign.  Common bile duct: Diameter: Normal caliber 3 mm diameter  Liver: Heterogeneous minimally coarsened echogenicity with nodular hepatic margins consistent with cirrhosis. Hepatopetal portal venous flow. No definite focal mass identified though portions of the LEFT lobe are inadequately visualized due to shadowing from bowel gas.  IVC: No abnormality visualized.  Pancreas: Visualized portion unremarkable.  Spleen: Incompletely visualized due to body habitus and bowel gas. Morphologically demonstrates rounded margins and suspect is enlarged though unable to accurately measure. No visualized focal mass lesion.  Right Kidney: Length: 10.5 cm. Cortical thinning. Upper normal cortical echogenicity. No mass or hydronephrosis.  Left Kidney: Length: 10.4 cm. Age-related cortical thinning. Upper normal echogenicity. No mass or hydronephrosis.  Abdominal aorta: Obscured by bowel gas  Other findings: Ascites in upper abdomen.  IMPRESSION: Limited exam demonstrating cirrhotic liver, ascites and probable splenomegaly though spleen is incompletely visualized.  No additional abnormalities identified by limited sonographic assessment.  If better intrahepatic  visualization is required such S for hepatic cellular carcinoma screening, consider MR imaging with and without contrast.   Electronically Signed   By: Lavonia Dana M.D.   On: 02/16/2014 13:11     Microbiology: No results found for this or any previous visit (from the past 240 hour(s)).   Labs: Basic Metabolic Panel:  Recent Labs Lab 02/15/14 1607 02/16/14 0856 02/17/14 0551 02/18/14 0502  NA 141 142 140 136  K 4.0 3.7 4.2 3.9  CL 103 104 103 99  CO2 32 32 31 29  GLUCOSE 174* 96 77 87  BUN 15 15 16 16   CREATININE 1.42* 1.38* 1.45* 1.34  CALCIUM 8.4 8.1* 8.5 8.0*  MG  --  2.0  --   --   PHOS  --  3.7  --  3.8   Liver Function Tests:  Recent Labs Lab 02/15/14 1607 02/16/14 0856 02/17/14 0551 02/18/14 0502  AST 74* 64* 64*  --   ALT 23 23 21   --   ALKPHOS 114 102 106  --   BILITOT 2.6* 2.6* 2.1*  --   PROT 6.5 6.3 6.1  --   ALBUMIN 2.0* 1.8* 1.8* 1.8*   No results for input(s): LIPASE, AMYLASE in the last 168 hours.  Recent Labs Lab 02/15/14 1607  AMMONIA 63*   CBC:  Recent Labs Lab 02/15/14 1607 02/16/14 0856 02/17/14 0551 02/18/14 0502  WBC 4.2 3.1* 3.6* 3.9*  HGB 11.9* 10.8* 10.9* 10.9*  HCT 35.4* 32.0* 32.1* 32.2*  MCV 99.2 97.6 97.0 97.0  PLT 81* 54* 53* 56*   Cardiac Enzymes:  Recent Labs Lab 02/15/14 1607  TROPONINI 0.03   BNP: Invalid input(s): POCBNP CBG:  Recent Labs Lab 02/17/14 1203 02/17/14 1731 02/17/14 2006 02/18/14 0740 02/18/14 1157  GLUCAP 128* 153* 104* 80 160*    Time coordinating discharge:  Greater than 30 minutes  Signed:  Caryle Helgeson, DO [facility] Pager: [phone] 02/18/2014, 12:51 PM

## 2014-02-18 NOTE — Progress Notes (Signed)
Patient ID: [name], male    DOB: 1935/04/18, 79 y.o.   MRN: [id number] Nephrology Progress Note  S: Sitting up on bedside. Just finished eating breakfast.   O:BP 111/51 mmHg  Pulse 60  Temp(Src) 99.2 F (37.3 C) (Oral)  Resp 16  Ht 5\' 7"  (1.702 m)  Wt 224 lb 6.9 oz (101.8 kg)  BMI 35.14 kg/m2  SpO2 95%  Intake/Output Summary (Last 24 hours) at 02/18/14 0952 Last data filed at 02/18/14 0600  Gross per 24 hour  Intake    240 ml  Output   1325 ml  Net  -1085 ml   Intake/Output: I/O last 3 completed shifts: In: 680 [P.O.:680] Out: 3125 [Urine:3125]  Intake/Output this shift:    Weight change: -3 lb 4.9 oz (-1.5 kg) HMC:NOBSJ and awake, sitting up on bedside.   GGE:ZMOQHUTMLYY, S1S2,  Resp:CTAB Abd: NT, soft, +BS, mildly distended.  Ext: +1 pitting b/l leg edema.    Recent Labs Lab 02/15/14 1607 02/16/14 0856 02/17/14 0551 02/18/14 0502  NA 141 142 140 136  K 4.0 3.7 4.2 3.9  CL 103 104 103 99  CO2 32 32 31 29  GLUCOSE 174* 96 77 87  BUN 15 15 16 16   CREATININE 1.42* 1.38* 1.45* 1.34  ALBUMIN 2.0* 1.8* 1.8* 1.8*  CALCIUM 8.4 8.1* 8.5 8.0*  PHOS  --  3.7  --  3.8  AST 74* 64* 64*  --   ALT 23 23 21   --    Liver Function Tests:  Recent Labs Lab 02/15/14 1607 02/16/14 0856 02/17/14 0551 02/18/14 0502  AST 74* 64* 64*  --   ALT 23 23 21   --   ALKPHOS 114 102 106  --   BILITOT 2.6* 2.6* 2.1*  --   PROT 6.5 6.3 6.1  --   ALBUMIN 2.0* 1.8* 1.8* 1.8*   No results for input(s): LIPASE, AMYLASE in the last 168 hours.  Recent Labs Lab 02/15/14 1607  AMMONIA 63*   CBC:  Recent Labs Lab 02/15/14 1607 02/16/14 0856 02/17/14 0551 02/18/14 0502  WBC 4.2 3.1* 3.6* 3.9*  HGB 11.9* 10.8* 10.9* 10.9*  HCT 35.4* 32.0* 32.1* 32.2*  MCV 99.2 97.6 97.0 97.0  PLT 81* 54* 53* 56*   Cardiac Enzymes:  Recent Labs Lab 02/15/14 1607  TROPONINI 0.03   CBG:  Recent Labs Lab 02/16/14 2133 02/17/14 0740 02/17/14 1203 02/17/14 1731  02/17/14 2006  GLUCAP 88 89 128* 153* 104*    Iron Studies: No results for input(s): IRON, TIBC, TRANSFERRIN, FERRITIN in the last 72 hours. Studies/Results: No results found. Marland Kitchen aMILoride  5 mg Oral Daily  . darifenacin  15 mg Oral Daily  . ferrous sulfate  325 mg Oral QHS  . furosemide  40 mg Oral BID  . guaiFENesin  600 mg Oral BID  . insulin aspart  0-5 Units Subcutaneous QHS  . insulin aspart  0-9 Units Subcutaneous TID WC  . insulin aspart protamine- aspart  25 Units Subcutaneous BID WC  . lactulose  20 g Oral TID  . pantoprazole  20 mg Oral QHS  . propranolol  10 mg Oral BID  . rifaximin  550 mg Oral BID  . rosuvastatin  10 mg Oral QPM  . sodium chloride  3 mL Intravenous Q12H  . venlafaxine  75 mg Oral BID    BMET    Component Value Date/Time   NA 136 02/18/2014 0502   K 3.9 02/18/2014 0502   CL 99  02/18/2014 0502   CO2 29 02/18/2014 0502   GLUCOSE 87 02/18/2014 0502   BUN 16 02/18/2014 0502   CREATININE 1.34 02/18/2014 0502   CALCIUM 8.0* 02/18/2014 0502   GFRNONAA 50* 02/18/2014 0502   GFRAA 58* 02/18/2014 0502   CBC    Component Value Date/Time   WBC 3.9* 02/18/2014 0502   RBC 3.32* 02/18/2014 0502   HGB 10.9* 02/18/2014 0502   HCT 32.2* 02/18/2014 0502   PLT 56* 02/18/2014 0502   MCV 97.0 02/18/2014 0502   MCH 32.8 02/18/2014 0502   MCHC 33.9 02/18/2014 0502   RDW 15.7* 02/18/2014 0502   LYMPHSABS 1.4 09/21/2012 1705   MONOABS 0.7 09/21/2012 1705   EOSABS 0.1 09/21/2012 1705   BASOSABS 0.0 09/21/2012 1705    Assessment/Plan: Orion Modest 79 y.o. male with PMH NASH (nonalcoholic steatohepatitis); Diabetes mellitus without complication; Kidney disease; Hypertension; GERD (gastroesophageal reflux disease); Depression; Anxiety; Sleep apnea; Anemia; and Esophageal varices. ; admitted 02/15/2014 for volume overload.   1. CKD 3. Continues to have good UOP. Received a dose of 80 mg IV this morning. Will discontinue and re-start home lasix tonight.   2. Fluid overload/anasarca: related to hypo-albuminemia from cirrhosis. Fluid restriction 1200 mL  3. Acute hypoxic resp failure: resolved  4. NASH cirrhosis/esophageal varices hepatic encephalopathy history - supportive care 5. HTN 6. OSA 7. DM2 8. GERD  9. Pancytopenia: chronic and stable.    Clearance Coots E 9:52 AM I have seen and examined this patient and agree with plan per Dr Raeford Razor.  Renal fx stable.  Not requiring O2 and says he has been up with PT walking and did not require O2.  Put back on his usual dose of lasix. I will sign off Nashaun Hillmer T,MD 02/18/2014 10:10 AM

## 2014-02-18 NOTE — Care Management Note (Signed)
    Page 1 of 2   02/18/2014     1:20:51 PM CARE MANAGEMENT NOTE 02/18/2014  Patient:  GREGG, WINCHELL   Account Number:  [id number]  Date Initiated:  02/18/2014  Documentation initiated by:  [facility]  Subjective/Objective Assessment:   adm: 1.  Fluid overload/anasarca     Action/Plan:   discharge planning   Anticipated DC Date:  02/18/2014   Anticipated DC Plan:  [place]  CM consult      [facility] Choice  HOME HEALTH   Choice offered to / List presented to:  C-3 Spouse   DME arranged  OTHER - SEE COMMENT      DME agency  [place] arranged  [place].   Status of service:  Completed, signed off Medicare Important Message given?   (If response is "NO", the following Medicare IM given date fields will be blank) Date Medicare IM given:   Medicare IM given by:   Date Additional Medicare IM given:   Additional Medicare IM given by:    Discharge Disposition:  [place]  Per UR Regulation:    If discussed at Long Length of Stay Meetings, dates discussed:    Comments:  02/18/14 13:00 CM met with pt in room to offer choice of home healtha agency.  Silva Bandy (wife) [phone] chooses [facility] to render HHPT/OT.  Address and contact information verifie by Silva Bandy.  Referral called to [facility] rep, [place].  CM called [facility] DME rep, Jeneen Rinks to please deliver 3n1 and rollator to room prior to discharge.  No other CM needs were communicated.  Mariane Masters, BSn, Cm [phone].

## 2014-02-20 NOTE — Care Management Note (Signed)
Late Entry:  Received call from pt wife, [name], who is concerned that they need help at home. This CM explained that [facility] would contact her at home and that the hospital is unable to setup care at home based on a long term care policy that they have. This CM added HHRN and Social worker to list of HH needs and contacted [facility] asking for followup with this pt and his wife as soon as possible , spoke with [facility] rep Debbie, who states that she will contact the office and ask that contact be made today and a visit if possible. [name] was encouraged to contact her insurance provider for clarification on benefits provided by her long term care policy. Hopefully the [facility] and social worker will be able to give her some direction in the community.     CRoyal RN MPH case manager [phone]      CARE MANAGEMENT NOTE 02/20/2014  Patient:  [name], [name]   Account Number:  [id number]  Date Initiated:  02/18/2014  Documentation initiated by:  [facility]  Subjective/Objective Assessment:   adm: 1.  Fluid overload/anasarca     Action/Plan:   discharge planning  02/20/2014 Call from pt wife, and HH needs conveyed to [facility]. HHRN and social worker added to [facility] services.   Anticipated DC Date:  02/18/2014   Anticipated DC Plan:  HOME W HOME HEALTH SERVICES      DC Planning Services  CM consult      [facility] Choice  HOME HEALTH   Choice offered to / List presented to:  C-3 Spouse   DME arranged  OTHER - SEE COMMENT      DME agency  [facility]     HH arranged  HH-2 PT  HH-3 OT  HH-1 RN  HH-6 SOCIAL WORKER      HH agency  [facility]   Status of service:  Completed, signed off Medicare Important Message given?   (If response is "NO", the following Medicare IM given date fields will be blank) Date Medicare IM given:   Medicare IM given by:   Date Additional Medicare IM given:   Additional Medicare IM given by:    Discharge Disposition:  HOME W HOME HEALTH  SERVICES  Per UR Regulation:    If discussed at Long Length of Stay Meetings, dates discussed:    Comments:  02/18/14 13:00 CM met with pt in room to offer choice of home healtha agency.  [name] (wife) [phone] chooses [facility] to render HHPT/OT.  Address and contact information verifie by [name].  Referral called to [facility] rep, [place].  CM called [facility] DME rep, Fayrene Fearing to please deliver 3n1 and rollator to room prior to discharge.  No other CM needs were communicated.  Freddy Jaksch, BSn, Cm [phone].  02/20/2014 Late Entry:  Received call from pt wife, [name], who is concerned that they need help at home. This CM explained that [facility] would contact her at home and that the hospital is unable to setup care at home based on a long term care policy that they have. This CM added HHRN and Social worker to list of HH needs and contacted [facility] asking for followup with this pt and his wife as soon as possible , spoke with [facility] rep Debbie, who states that she will contact the office and ask that contact be made today and a visit if possible. [name] was encouraged to contact her insurance provider for clarification on benefits  provided by her long term care policy. Hopefully the [facility] and social worker will be able to give her some direction in the community.   CRoyal RN MPH case manager [phone]

## 2014-02-21 LAB — HEMOGLOBIN A1C
HEMOGLOBIN A1C: 5.3 % (ref 4.8–5.6)
MEAN PLASMA GLUCOSE: 105 mg/dL

## 2014-03-27 ENCOUNTER — Emergency Department (HOSPITAL_COMMUNITY): Payer: BC Managed Care – PPO

## 2014-03-27 ENCOUNTER — Inpatient Hospital Stay (HOSPITAL_COMMUNITY)
Admission: EM | Admit: 2014-03-27 | Discharge: 2014-04-05 | DRG: 442 | Disposition: A | Discharge status: Hospice | Payer: BC Managed Care – PPO | Attending: Internal Medicine | Admitting: Internal Medicine

## 2014-03-27 ENCOUNTER — Encounter (HOSPITAL_COMMUNITY): Payer: Self-pay

## 2014-03-27 DIAGNOSIS — K7460 Unspecified cirrhosis of liver: Secondary | ICD-10-CM | POA: Diagnosis present

## 2014-03-27 DIAGNOSIS — I10 Essential (primary) hypertension: Secondary | ICD-10-CM | POA: Diagnosis present

## 2014-03-27 DIAGNOSIS — N182 Chronic kidney disease, stage 2 (mild): Secondary | ICD-10-CM | POA: Diagnosis present

## 2014-03-27 DIAGNOSIS — G4730 Sleep apnea, unspecified: Secondary | ICD-10-CM | POA: Diagnosis present

## 2014-03-27 DIAGNOSIS — Z515 Encounter for palliative care: Secondary | ICD-10-CM | POA: Diagnosis not present

## 2014-03-27 DIAGNOSIS — Z888 Allergy status to other drugs, medicaments and biological substances status: Secondary | ICD-10-CM

## 2014-03-27 DIAGNOSIS — I129 Hypertensive chronic kidney disease with stage 1 through stage 4 chronic kidney disease, or unspecified chronic kidney disease: Secondary | ICD-10-CM | POA: Diagnosis present

## 2014-03-27 DIAGNOSIS — I8500 Esophageal varices without bleeding: Secondary | ICD-10-CM | POA: Diagnosis present

## 2014-03-27 DIAGNOSIS — Z79899 Other long term (current) drug therapy: Secondary | ICD-10-CM | POA: Diagnosis not present

## 2014-03-27 DIAGNOSIS — K7290 Hepatic failure, unspecified without coma: Principal | ICD-10-CM | POA: Diagnosis present

## 2014-03-27 DIAGNOSIS — Z66 Do not resuscitate: Secondary | ICD-10-CM | POA: Diagnosis present

## 2014-03-27 DIAGNOSIS — G9340 Encephalopathy, unspecified: Secondary | ICD-10-CM | POA: Diagnosis not present

## 2014-03-27 DIAGNOSIS — K7581 Nonalcoholic steatohepatitis (NASH): Secondary | ICD-10-CM | POA: Diagnosis present

## 2014-03-27 DIAGNOSIS — K7682 Hepatic encephalopathy: Secondary | ICD-10-CM | POA: Diagnosis present

## 2014-03-27 DIAGNOSIS — I5032 Chronic diastolic (congestive) heart failure: Secondary | ICD-10-CM | POA: Diagnosis present

## 2014-03-27 DIAGNOSIS — D696 Thrombocytopenia, unspecified: Secondary | ICD-10-CM | POA: Diagnosis present

## 2014-03-27 DIAGNOSIS — Z794 Long term (current) use of insulin: Secondary | ICD-10-CM | POA: Diagnosis not present

## 2014-03-27 DIAGNOSIS — K219 Gastro-esophageal reflux disease without esophagitis: Secondary | ICD-10-CM | POA: Diagnosis present

## 2014-03-27 DIAGNOSIS — F329 Major depressive disorder, single episode, unspecified: Secondary | ICD-10-CM | POA: Diagnosis present

## 2014-03-27 DIAGNOSIS — R4182 Altered mental status, unspecified: Secondary | ICD-10-CM | POA: Diagnosis not present

## 2014-03-27 DIAGNOSIS — Z833 Family history of diabetes mellitus: Secondary | ICD-10-CM | POA: Diagnosis not present

## 2014-03-27 DIAGNOSIS — D6959 Other secondary thrombocytopenia: Secondary | ICD-10-CM | POA: Diagnosis present

## 2014-03-27 DIAGNOSIS — R945 Abnormal results of liver function studies: Secondary | ICD-10-CM

## 2014-03-27 DIAGNOSIS — Z811 Family history of alcohol abuse and dependence: Secondary | ICD-10-CM | POA: Diagnosis not present

## 2014-03-27 DIAGNOSIS — E119 Type 2 diabetes mellitus without complications: Secondary | ICD-10-CM | POA: Diagnosis present

## 2014-03-27 DIAGNOSIS — Z7189 Other specified counseling: Secondary | ICD-10-CM

## 2014-03-27 DIAGNOSIS — K5900 Constipation, unspecified: Secondary | ICD-10-CM | POA: Diagnosis not present

## 2014-03-27 DIAGNOSIS — R451 Restlessness and agitation: Secondary | ICD-10-CM | POA: Insufficient documentation

## 2014-03-27 DIAGNOSIS — R7989 Other specified abnormal findings of blood chemistry: Secondary | ICD-10-CM

## 2014-03-27 LAB — URINALYSIS, ROUTINE W REFLEX MICROSCOPIC
Bilirubin Urine: NEGATIVE
Glucose, UA: NEGATIVE mg/dL
Hgb urine dipstick: NEGATIVE
KETONES UR: NEGATIVE mg/dL
LEUKOCYTES UA: NEGATIVE
Nitrite: NEGATIVE
PH: 6.5 (ref 5.0–8.0)
Protein, ur: NEGATIVE mg/dL
SPECIFIC GRAVITY, URINE: 1.017 (ref 1.005–1.030)
Urobilinogen, UA: 1 mg/dL (ref 0.0–1.0)

## 2014-03-27 LAB — CBC
HCT: 31.5 % — ABNORMAL LOW (ref 39.0–52.0)
HEMOGLOBIN: 10.7 g/dL — AB (ref 13.0–17.0)
MCH: 32.9 pg (ref 26.0–34.0)
MCHC: 34 g/dL (ref 30.0–36.0)
MCV: 96.9 fL (ref 78.0–100.0)
PLATELETS: 64 10*3/uL — AB (ref 150–400)
RBC: 3.25 MIL/uL — AB (ref 4.22–5.81)
RDW: 16.8 % — AB (ref 11.5–15.5)
WBC: 3.6 10*3/uL — ABNORMAL LOW (ref 4.0–10.5)

## 2014-03-27 LAB — I-STAT CG4 LACTIC ACID, ED: Lactic Acid, Venous: 2.89 mmol/L (ref 0.5–2.0)

## 2014-03-27 LAB — COMPREHENSIVE METABOLIC PANEL
ALT: 39 U/L (ref 0–53)
ANION GAP: 8 (ref 5–15)
AST: 81 U/L — ABNORMAL HIGH (ref 0–37)
Albumin: 2 g/dL — ABNORMAL LOW (ref 3.5–5.2)
Alkaline Phosphatase: 134 U/L — ABNORMAL HIGH (ref 39–117)
BILIRUBIN TOTAL: 2.7 mg/dL — AB (ref 0.3–1.2)
BUN: 15 mg/dL (ref 6–23)
CO2: 26 mmol/L (ref 19–32)
CREATININE: 1.23 mg/dL (ref 0.50–1.35)
Calcium: 8.6 mg/dL (ref 8.4–10.5)
Chloride: 102 mmol/L (ref 96–112)
GFR calc Af Amer: 64 mL/min — ABNORMAL LOW (ref >90)
GFR calc non Af Amer: 55 mL/min — ABNORMAL LOW (ref >90)
Glucose, Bld: 138 mg/dL — ABNORMAL HIGH (ref 70–99)
Potassium: 4.2 mmol/L (ref 3.5–5.1)
SODIUM: 136 mmol/L (ref 135–145)
TOTAL PROTEIN: 7 g/dL (ref 6.0–8.3)

## 2014-03-27 LAB — CBG MONITORING, ED: Glucose-Capillary: 126 mg/dL — ABNORMAL HIGH (ref 70–99)

## 2014-03-27 LAB — AMMONIA: Ammonia: 95 umol/L — ABNORMAL HIGH (ref 11–32)

## 2014-03-27 MED ORDER — FLEET ENEMA 7-19 GM/118ML RE ENEM: 1.0000 | ENEMA | Freq: Once | RECTAL | Status: AC | PRN

## 2014-03-27 MED ORDER — VENLAFAXINE HCL 75 MG PO TABS
75.0000 mg | ORAL_TABLET | Freq: Two times a day (BID) | ORAL | Status: DC
  Administered 2014-03-28 – 2014-04-02 (×12): 75 mg via ORAL
  Filled 2014-03-27 (×16): qty 1

## 2014-03-27 MED ORDER — SODIUM CHLORIDE 0.9 % IJ SOLN
3.0000 mL | INTRAMUSCULAR | Status: DC | PRN
  Administered 2014-03-28: 3 mL via INTRAVENOUS
  Filled 2014-03-27: qty 3

## 2014-03-27 MED ORDER — LACTULOSE 10 GM/15ML PO SOLN
40.0000 g | Freq: Three times a day (TID) | ORAL | Status: DC
  Administered 2014-03-28 – 2014-03-29 (×4): 40 g via ORAL
  Filled 2014-03-27 (×7): qty 60

## 2014-03-27 MED ORDER — INSULIN ASPART 100 UNIT/ML ~~LOC~~ SOLN: 0.0000 [IU] | Freq: Three times a day (TID) | SUBCUTANEOUS | Status: DC

## 2014-03-27 MED ORDER — RIFAXIMIN 550 MG PO TABS
550.0000 mg | ORAL_TABLET | Freq: Two times a day (BID) | ORAL | Status: DC
  Administered 2014-03-28 – 2014-04-02 (×12): 550 mg via ORAL
  Filled 2014-03-27 (×15): qty 1

## 2014-03-27 MED ORDER — SODIUM CHLORIDE 0.9 % IJ SOLN
3.0000 mL | Freq: Two times a day (BID) | INTRAMUSCULAR | Status: DC
  Administered 2014-03-29 – 2014-03-31 (×4): 3 mL via INTRAVENOUS

## 2014-03-27 MED ORDER — FUROSEMIDE 40 MG PO TABS
40.0000 mg | ORAL_TABLET | Freq: Every day | ORAL | Status: DC
  Administered 2014-03-28 – 2014-03-29 (×2): 40 mg via ORAL
  Filled 2014-03-27 (×4): qty 1

## 2014-03-27 MED ORDER — FERROUS SULFATE 325 (65 FE) MG PO TABS
325.0000 mg | ORAL_TABLET | Freq: Every morning | ORAL | Status: DC
  Administered 2014-03-28 – 2014-03-30 (×3): 325 mg via ORAL
  Filled 2014-03-27 (×4): qty 1

## 2014-03-27 MED ORDER — ONDANSETRON HCL 4 MG PO TABS: 4.0000 mg | ORAL_TABLET | Freq: Four times a day (QID) | ORAL | Status: DC | PRN

## 2014-03-27 MED ORDER — AMILORIDE HCL 5 MG PO TABS
5.0000 mg | ORAL_TABLET | Freq: Every day | ORAL | Status: DC
  Administered 2014-03-28 – 2014-03-29 (×2): 5 mg via ORAL
  Filled 2014-03-27 (×4): qty 1

## 2014-03-27 MED ORDER — DOCUSATE SODIUM 100 MG PO CAPS
100.0000 mg | ORAL_CAPSULE | Freq: Two times a day (BID) | ORAL | Status: DC
  Administered 2014-03-28 – 2014-03-30 (×7): 100 mg via ORAL
  Filled 2014-03-27 (×10): qty 1

## 2014-03-27 MED ORDER — BISACODYL 10 MG RE SUPP: 10.0000 mg | Freq: Every day | RECTAL | Status: DC | PRN

## 2014-03-27 MED ORDER — SENNA 8.6 MG PO TABS
1.0000 | ORAL_TABLET | Freq: Two times a day (BID) | ORAL | Status: DC
  Administered 2014-03-28 – 2014-03-29 (×3): 8.6 mg via ORAL
  Filled 2014-03-27 (×5): qty 1

## 2014-03-27 MED ORDER — PANTOPRAZOLE SODIUM 40 MG PO TBEC
40.0000 mg | DELAYED_RELEASE_TABLET | Freq: Every day | ORAL | Status: DC
  Administered 2014-03-28 – 2014-03-30 (×3): 40 mg via ORAL
  Filled 2014-03-27 (×3): qty 1

## 2014-03-27 MED ORDER — SODIUM CHLORIDE 0.9 % IJ SOLN
3.0000 mL | Freq: Two times a day (BID) | INTRAMUSCULAR | Status: DC
  Administered 2014-03-28 – 2014-03-30 (×5): 3 mL via INTRAVENOUS

## 2014-03-27 MED ORDER — SODIUM CHLORIDE 0.9 % IV SOLN: 250.0000 mL | INTRAVENOUS | Status: DC | PRN

## 2014-03-27 MED ORDER — ONDANSETRON HCL 4 MG/2ML IJ SOLN: 4.0000 mg | Freq: Four times a day (QID) | INTRAMUSCULAR | Status: DC | PRN

## 2014-03-27 MED ORDER — LACTULOSE 10 GM/15ML PO SOLN
30.0000 g | Freq: Once | ORAL | Status: AC
  Administered 2014-03-27: 30 g via ORAL
  Filled 2014-03-27: qty 45

## 2014-03-27 MED ORDER — PROPRANOLOL HCL 10 MG PO TABS
10.0000 mg | ORAL_TABLET | Freq: Two times a day (BID) | ORAL | Status: DC
  Administered 2014-03-28 – 2014-04-02 (×11): 10 mg via ORAL
  Filled 2014-03-27 (×15): qty 1

## 2014-03-27 MED ORDER — INSULIN ASPART 100 UNIT/ML ~~LOC~~ SOLN: 0.0000 [IU] | Freq: Every day | SUBCUTANEOUS | Status: DC

## 2014-03-27 MED ORDER — DARIFENACIN HYDROBROMIDE ER 15 MG PO TB24
15.0000 mg | ORAL_TABLET | Freq: Every day | ORAL | Status: DC
  Administered 2014-03-28 – 2014-03-30 (×3): 15 mg via ORAL
  Filled 2014-03-27 (×4): qty 1

## 2014-03-27 MED ORDER — POLYETHYLENE GLYCOL 3350 17 G PO PACK
17.0000 g | PACK | Freq: Every day | ORAL | Status: DC | PRN
  Filled 2014-03-27: qty 1

## 2014-03-27 NOTE — ED Notes (Signed)
[facility]- pt coming from [facility] senior living after wife reports confusion since d/c from [facility] on 3/11. Pt is very talkative but able to answer all questions appropriately. No distress noted.

## 2014-03-27 NOTE — ED Notes (Signed)
I-stat CG4 result given to [facility]

## 2014-03-27 NOTE — ED Provider Notes (Signed)
CSN: [id number]     Arrival date & time 03/27/14  1522 History   First MD Initiated Contact with Patient 03/27/14 1631     Chief Complaint  Patient presents with  . Altered Mental Status     (Consider location/radiation/quality/duration/timing/severity/associated sxs/prior Treatment) HPI Comments: Patient with a history of [place] cirrhosis, diabetes mellitus and chronic kidney disease presents with altered mental status. He comes from a nursing facility. His wife says he was admitted to [facility] hospital for confusion which is felt to be related to an elevated ammonia level as well as an anesthetic was administered due to dental procedure. He was discharged on March 11. Over the last 3-4 days he's had some worsening confusion. His wife states he actually is little bit better right now. He's had no fevers or recent illnesses. He has not had a bowel movement since yesterday. He normally supposed to have 4-5 bowel movements a day. He's had no vomiting or diarrhea. No fevers. No coughing or congestion. No recent known falls.  Patient is a 79 y.o. male presenting with altered mental status.  Altered Mental Status   Past Medical History  Diagnosis Date  . NASH (nonalcoholic steatohepatitis)   . Diabetes mellitus without complication   . Kidney disease   . Hypertension   . GERD (gastroesophageal reflux disease)     esophageal varices  . Depression   . Anxiety   . Sleep apnea     use CPAP-14  . Anemia   . Esophageal varices    Past Surgical History  Procedure Laterality Date  . Tonsillectomy    . Left arm surgery  1984    crush injury-pins  . Esophagogastroduodenoscopy (egd) with propofol N/A 11/18/2012    Procedure: ESOPHAGOGASTRODUODENOSCOPY (EGD) WITH PROPOFOL;  Surgeon: Cleotis Nipper, MD;  Location: [facility] ENDOSCOPY;  Service: Endoscopy;  Laterality: N/A;  . Colonoscopy with propofol N/A 11/18/2012    Procedure: COLONOSCOPY WITH PROPOFOL;  Surgeon: Cleotis Nipper, MD;   Location: [facility] ENDOSCOPY;  Service: Endoscopy;  Laterality: N/A;   Family History  Problem Relation Age of Onset  . Diabetes type II Mother   . Alcoholism Father   . Diabetes type II Sister    History  Substance Use Topics  . Smoking status: Never Smoker   . Smokeless tobacco: Not on file  . Alcohol Use: Yes     Comment: occ    Review of Systems  Unable to perform ROS: Mental status change      Allergies  Aldactone  Home Medications   Prior to Admission medications   Medication Sig Start Date End Date Taking? Authorizing Provider  aMILoride (MIDAMOR) 5 MG tablet Take 5 mg by mouth daily.    Historical Provider, MD  cholecalciferol (VITAMIN D) 1000 UNITS tablet Take 1,000 Units by mouth every morning.    Historical Provider, MD  clotrimazole-betamethasone (LOTRISONE) cream Apply 1 application topically 2 (two) times daily.    Historical Provider, MD  ferrous sulfate 325 (65 FE) MG tablet Take 325 mg by mouth at bedtime.    Historical Provider, MD  furosemide (LASIX) 40 MG tablet Take 40 mg by mouth 2 (two) times daily.    Historical Provider, MD  insulin aspart protamine- aspart (NOVOLOG MIX 70/30) (70-30) 100 UNIT/ML injection Inject 30 Units into the skin 2 (two) times daily with a meal.    Historical Provider, MD  ketoconazole (NIZORAL) 2 % shampoo Apply 1 application topically 2 (two) times a week.  Historical Provider, MD  lactulose (CHRONULAC) 10 GM/15ML solution Take 30 mLs (20 g total) by mouth 3 (three) times daily. 02/18/14   Orson Eva, MD  Multiple Vitamin (MULTIVITAMIN WITH MINERALS) TABS tablet Take 1 tablet by mouth daily.    Historical Provider, MD  omeprazole (PRILOSEC) 20 MG capsule Take 20 mg by mouth daily.    Historical Provider, MD  PRESCRIPTION MEDICATION Apply 1 application topically daily as needed. Eucerin/Triamcinolone 0.1% apply for dry skin    Historical Provider, MD  Probiotic Product (PROBIOTIC DAILY) CAPS Take 2 capsules by mouth every morning.     Historical Provider, MD  propranolol (INDERAL) 10 MG tablet Take 10 mg by mouth 2 (two) times daily.    Historical Provider, MD  rifaximin (XIFAXAN) 550 MG TABS tablet Take 550 mg by mouth 2 (two) times daily.    Historical Provider, MD  rosuvastatin (CRESTOR) 20 MG tablet Take 10 mg by mouth every evening.     Historical Provider, MD  solifenacin (VESICARE) 10 MG tablet Take 10 mg by mouth daily.    Historical Provider, MD  venlafaxine (EFFEXOR) 75 MG tablet Take 75 mg by mouth 2 (two) times daily.    Historical Provider, MD   BP 111/63 mmHg  Pulse 63  Temp(Src) 99.3 F (37.4 C) (Rectal)  Resp 16  SpO2 100% Physical Exam  Constitutional: He appears well-developed and well-nourished.  HENT:  Head: Normocephalic and atraumatic.  Eyes: Pupils are equal, round, and reactive to light.  Sclera nonicteric  Neck: Normal range of motion. Neck supple.  Cardiovascular: Normal rate, regular rhythm and normal heart sounds.   Pulmonary/Chest: Effort normal and breath sounds normal. No respiratory distress. He has no wheezes. He has no rales. He exhibits no tenderness.  Abdominal: Soft. Bowel sounds are normal. There is no tenderness. There is no rebound and no guarding.  Musculoskeletal: Normal range of motion. He exhibits edema (2+ pitting edema bilaterally).  Lymphadenopathy:    He has no cervical adenopathy.  Neurological: He is alert.  Patient is alert and oriented to person and place but confused to time. He moves all extremities symmetrically. Motor 5 out of 5 all extremities. Sensation grossly intact to light touch all extremities. Cranial nerves II through XII grossly intact. Finger-nose intact.  Skin: Skin is warm and dry. No rash noted.  Psychiatric: He has a normal mood and affect.    ED Course  Procedures (including critical care time) Labs Review Labs Reviewed  CBC - Abnormal; Notable for the following:    WBC 3.6 (*)    RBC 3.25 (*)    Hemoglobin 10.7 (*)    HCT 31.5 (*)     RDW 16.8 (*)    Platelets 64 (*)    All other components within normal limits  COMPREHENSIVE METABOLIC PANEL - Abnormal; Notable for the following:    Glucose, Bld 138 (*)    Albumin 2.0 (*)    AST 81 (*)    Alkaline Phosphatase 134 (*)    Total Bilirubin 2.7 (*)    GFR calc non Af Amer 55 (*)    GFR calc Af Amer 64 (*)    All other components within normal limits  AMMONIA - Abnormal; Notable for the following:    Ammonia 95 (*)    All other components within normal limits  CBG MONITORING, ED - Abnormal; Notable for the following:    Glucose-Capillary 126 (*)    All other components within normal limits  I-STAT CG4  LACTIC ACID, ED - Abnormal; Notable for the following:    Lactic Acid, Venous 2.89 (*)    All other components within normal limits  URINALYSIS, ROUTINE W REFLEX MICROSCOPIC  CBG MONITORING, ED    Imaging Review Dg Chest 2 View  03/27/2014   CLINICAL DATA:  Altered mental status, history of hypertension and diabetes  EXAM: CHEST  2 VIEW  COMPARISON:  Portable chest x-ray of March 15, 2014  FINDINGS: The film was obtained in a lordotic fashion. In addition the lungs are hypoinflated. There is no focal infiltrate. The cardiac silhouette is enlarged. The pulmonary vascularity is not engorged. There is no pleural effusion. There is mild loss of height of the body of L1.  IMPRESSION: The stomach study limited technically due to positioning and hypoinflation. No acute cardiopulmonary abnormality is demonstrated.   Electronically Signed   By: David  Martinique   On: 03/27/2014 17:21   Ct Head Wo Contrast  03/27/2014   CLINICAL DATA:  Altered mental status.  EXAM: CT HEAD WITHOUT CONTRAST  TECHNIQUE: Contiguous axial images were obtained from the base of the skull through the vertex without intravenous contrast.  COMPARISON:  Head CT scan 03/15/2014.  FINDINGS: Again seen is some cortical atrophy and chronic microvascular ischemic change. No evidence of acute abnormality including  hemorrhage, infarct, mass lesion, mass effect, midline shift or abnormal extra-axial fluid collection. No hydrocephalus or pneumocephalus. The calvarium is intact.  IMPRESSION: No acute abnormality.   Electronically Signed   By: Inge Rise M.D.   On: 03/27/2014 19:30     EKG Interpretation   Date/Time:  Monday March 27 2014 17:01:13 EDT Ventricular Rate:  62 PR Interval:  168 QRS Duration: 92 QT Interval:  482 QTC Calculation: 489 R Axis:   -2 Text Interpretation:  Normal sinus rhythm Prolonged QT Abnormal ECG since  last tracing no significant change Confirmed by Marcie Shearon  MD, Jawon Dipiero ([id number])  on 03/27/2014 6:17:39 PM      MDM   Final diagnoses:  Altered mental status, unspecified altered mental status type  Liver failure    Patient presents with confusion. His ammonia level is elevated. He has no other evident etiology for his confusion. His head CT is negative. He has no evidence of intracranial hemorrhage. He has no focal neurologic deficits or suggestions of a stroke. His lactate is elevated but he has no other suggestions of sepsis. His blood pressure is normal. His rectal temp is afebrile. His chest x-ray is clear. His urinalysis is negative for infection. His lactate may be elevated due to his liver failure. I feel that his confusion is most likely due to his elevated ammonia level. He was given dose of lactulose in the ED. I will consult the hospitalist for admission.    Malvin Johns, MD 03/27/14 2020

## 2014-03-27 NOTE — H&P (Signed)
PCP: Shirline Frees, MD  [facility]  Chief Complaint: Confusion  HPI: [name] is a 79 y.o. male   has a past medical history of NASH (nonalcoholic steatohepatitis); Diabetes mellitus without complication; Kidney disease; Hypertension; GERD (gastroesophageal reflux disease); Depression; Anxiety; Sleep apnea; Anemia; and Esophageal varices.   Patient was admitted to beginning of February with fluid overload and anasarca was diuresed with IV Lasix, she has normal EF. Echo gram in 2014. He has history of NASH followed by [facility].  Due to his prior admission patient was on lactulose and rifaximin. Morene Rankins showed cirrhosis with minimal ascites and splenomegaly discharged to home with home health.  This then he has been admitted to med [facility] for confusion after he had anesthesia for 20 teeth extraction on March 2nd. Patient was not waking up from anesthesia and was severely sedated. and discharged on March 11. Patient Patient is residing at [facility] . Confusion was likely secondary to anesthesia.  Wife states patient has been constipated. His lactulose dose has not been changed but his lasix has been decreased to 40 mg BID. Wife staes he has been confused since discharged but for the past few days it has been worse. Ammonia level was up to 95  Hospitalist was called for admission for  Hepatic encephalopathy  Review of Systems:    Pertinent positives include:  confusion  Constitutional:  No weight loss, night sweats, Fevers, chills, fatigue, weight loss  HEENT:  No headaches, Difficulty swallowing,Tooth/dental problems,Sore throat,  No sneezing, itching, ear ache, nasal congestion, post nasal drip,  Cardio-vascular:  No chest pain, Orthopnea, PND, anasarca, dizziness, palpitations.no Bilateral lower extremity swelling  GI:  No heartburn, indigestion, abdominal pain, nausea, vomiting, diarrhea, change in  bowel habits, loss of appetite, melena, blood in stool, hematemesis Resp:  no shortness of breath at rest. No dyspnea on exertion, No excess mucus, no productive cough, No non-productive cough, No coughing up of blood.No change in color of mucus.No wheezing. Skin:  no rash or lesions. No jaundice GU:  no dysuria, change in color of urine, no urgency or frequency. No straining to urinate.  No flank pain.  Musculoskeletal:  No joint pain or no joint swelling. No decreased range of motion. No back pain.  Psych:  No change in mood or affect. No depression or anxiety. No memory loss.  Neuro: no localizing neurological complaints, no tingling, no weakness, no double vision, no gait abnormality, no slurred speech, no   Otherwise ROS are negative except for above, 10 systems were reviewed  Past Medical History: Past Medical History  Diagnosis Date  . NASH (nonalcoholic steatohepatitis)   . Diabetes mellitus without complication   . Kidney disease   . Hypertension   . GERD (gastroesophageal reflux disease)     esophageal varices  . Depression   . Anxiety   . Sleep apnea     use CPAP-14  . Anemia   . Esophageal varices    Past Surgical History  Procedure Laterality Date  . Tonsillectomy    . Left arm surgery  1984    crush injury-pins  . Esophagogastroduodenoscopy (egd) with propofol N/A 11/18/2012    Procedure: ESOPHAGOGASTRODUODENOSCOPY (EGD) WITH PROPOFOL;  Surgeon: Cleotis Nipper, MD;  Location: [facility];  Service: Endoscopy;  Laterality: N/A;  . Colonoscopy with propofol N/A 11/18/2012    Procedure: COLONOSCOPY WITH PROPOFOL;  Surgeon: Cleotis Nipper, MD;  Location: [facility];  Service:  Endoscopy;  Laterality: N/A;     Medications: Prior to Admission medications   Medication Sig Start Date End Date Taking? Authorizing Provider  aMILoride (MIDAMOR) 5 MG tablet Take 5 mg by mouth daily.   Yes Historical Provider, MD  cholecalciferol (VITAMIN D) 1000 UNITS tablet Take  1,000 Units by mouth every evening.    Yes Historical Provider, MD  clotrimazole-betamethasone (LOTRISONE) cream Apply 1 application topically as needed (for rash).    Yes Historical Provider, MD  ferrous sulfate 325 (65 FE) MG tablet Take 325 mg by mouth every morning.    Yes Historical Provider, MD  furosemide (LASIX) 40 MG tablet Take 40 mg by mouth 2 (two) times daily.   Yes Historical Provider, MD  lactulose (CHRONULAC) 10 GM/15ML solution Take 30 mLs (20 g total) by mouth 3 (three) times daily. Patient taking differently: Take 20-40 g by mouth 3 (three) times daily. Takes 60 ml's in morning, 30 ml's at lunch and 60 ml's in evening 02/18/14  Yes Orson Eva, MD  Multiple Vitamin (MULTIVITAMIN WITH MINERALS) TABS tablet Take 1 tablet by mouth every evening.    Yes Historical Provider, MD  omeprazole (PRILOSEC) 20 MG capsule Take 20 mg by mouth daily.   Yes Historical Provider, MD  PRESCRIPTION MEDICATION Apply 1 application topically daily as needed. Eucerin/Triamcinolone 0.1% apply for dry skin   Yes Historical Provider, MD  Probiotic Product (PROBIOTIC DAILY) CAPS Take 1 capsule by mouth every morning.    Yes Historical Provider, MD  propranolol (INDERAL) 10 MG tablet Take 10 mg by mouth 2 (two) times daily.   Yes Historical Provider, MD  rifaximin (XIFAXAN) 550 MG TABS tablet Take 550 mg by mouth 2 (two) times daily.   Yes Historical Provider, MD  solifenacin (VESICARE) 10 MG tablet Take 10 mg by mouth daily.   Yes Historical Provider, MD  venlafaxine (EFFEXOR) 75 MG tablet Take 75 mg by mouth 2 (two) times daily.   Yes Historical Provider, MD  insulin aspart protamine- aspart (NOVOLOG MIX 70/30) (70-30) 100 UNIT/ML injection Inject 30 Units into the skin 2 (two) times daily with a meal.    Historical Provider, MD    Allergies:   Allergies  Allergen Reactions  . Aldactone [Spironolactone] Swelling    Breast swell, painful    Social History:  Ambulatory   independently  Or walker     From facility [facility], [place]   reports that he has never smoked. He does not have any smokeless tobacco history on file. He reports that he drinks alcohol. He reports that he does not use illicit drugs.    Family History: family history includes Alcoholism in his father; Diabetes type II in his mother and sister.    Physical Exam: Patient Vitals for the past 24 hrs:  BP Temp Temp src Pulse Resp SpO2  03/27/14 1842 - 99.3 F (37.4 C) Rectal - - -  03/27/14 1635 111/63 mmHg 98.4 F (36.9 C) Oral 63 16 100 %  03/27/14 1539 (!) 138/54 mmHg 98.5 F (36.9 C) Oral 61 18 100 %  03/27/14 1537 - - - - - 100 %    1. General:  in No Acute distress 2. Psychological: Alert but not Oriented 3. Head/ENT:   Moist   Mucous Membranes                          Head Non traumatic, neck supple  adentulous 4. SKIN:  decreased Skin turgor,  Skin clean Dry and intact no rash 5. Heart: Regular rate and rhythm no Murmur, Rub or gallop 6. Lungs:  no wheezes or crackles   7. Abdomen: Soft, non-tender, Non distended 8. Lower extremities: no clubbing, cyanosis, or edema 9. Neurologically Grossly intact, moving all 4 extremities equally no asterexis 10. MSK: Normal range of motion  body mass index is unknown because there is no weight on file.   Labs on Admission:   Results for orders placed or performed during the hospital encounter of 03/27/14 (from the past 24 hour(s))  CBC     Status: Abnormal   Collection Time: 03/27/14  4:09 PM  Result Value Ref Range   WBC 3.6 (L) 4.0 - 10.5 K/uL   RBC 3.25 (L) 4.22 - 5.81 MIL/uL   Hemoglobin 10.7 (L) 13.0 - 17.0 g/dL   HCT 31.5 (L) 39.0 - 52.0 %   MCV 96.9 78.0 - 100.0 fL   MCH 32.9 26.0 - 34.0 pg   MCHC 34.0 30.0 - 36.0 g/dL   RDW 16.8 (H) 11.5 - 15.5 %   Platelets 64 (L) 150 - 400 K/uL  Comprehensive metabolic panel     Status: Abnormal   Collection Time: 03/27/14  4:09 PM  Result Value Ref Range   Sodium 136 135  - 145 mmol/L   Potassium 4.2 3.5 - 5.1 mmol/L   Chloride 102 96 - 112 mmol/L   CO2 26 19 - 32 mmol/L   Glucose, Bld 138 (H) 70 - 99 mg/dL   BUN 15 6 - 23 mg/dL   Creatinine, Ser 1.23 0.50 - 1.35 mg/dL   Calcium 8.6 8.4 - 10.5 mg/dL   Total Protein 7.0 6.0 - 8.3 g/dL   Albumin 2.0 (L) 3.5 - 5.2 g/dL   AST 81 (H) 0 - 37 U/L   ALT 39 0 - 53 U/L   Alkaline Phosphatase 134 (H) 39 - 117 U/L   Total Bilirubin 2.7 (H) 0.3 - 1.2 mg/dL   GFR calc non Af Amer 55 (L) >90 mL/min   GFR calc Af Amer 64 (L) >90 mL/min   Anion gap 8 5 - 15  CBG monitoring, ED     Status: Abnormal   Collection Time: 03/27/14  4:28 PM  Result Value Ref Range   Glucose-Capillary 126 (H) 70 - 99 mg/dL  I-Stat CG4 Lactic Acid, ED     Status: Abnormal   Collection Time: 03/27/14  4:48 PM  Result Value Ref Range   Lactic Acid, Venous 2.89 (HH) 0.5 - 2.0 mmol/L   Comment NOTIFIED PHYSICIAN   Ammonia     Status: Abnormal   Collection Time: 03/27/14  5:50 PM  Result Value Ref Range   Ammonia 95 (H) 11 - 32 umol/L  Urinalysis, Routine w reflex microscopic     Status: None   Collection Time: 03/27/14  6:39 PM  Result Value Ref Range   Color, Urine YELLOW YELLOW   APPearance CLEAR CLEAR   Specific Gravity, Urine 1.017 1.005 - 1.030   pH 6.5 5.0 - 8.0   Glucose, UA NEGATIVE NEGATIVE mg/dL   Hgb urine dipstick NEGATIVE NEGATIVE   Bilirubin Urine NEGATIVE NEGATIVE   Ketones, ur NEGATIVE NEGATIVE mg/dL   Protein, ur NEGATIVE NEGATIVE mg/dL   Urobilinogen, UA 1.0 0.0 - 1.0 mg/dL   Nitrite NEGATIVE NEGATIVE   Leukocytes, UA NEGATIVE NEGATIVE    UA no uti  Lab Results  Component Value Date  HGBA1C 5.3 02/16/2014    CrCl cannot be calculated (Unknown ideal weight.).  BNP (last 3 results) No results for input(s): PROBNP in the last 8760 hours.  Other results:  I have pearsonaly reviewed this: ECG REPORT  Rate: 62  Rhythm: nsr ST&T Change: NO ISCHEMIA QTc 489   There were no vitals filed for this  visit.   Cultures: No results found for: [place], [place], CULT, REPTSTATUS   Radiological Exams on Admission: Dg Chest 2 View  03/27/2014   CLINICAL DATA:  Altered mental status, history of hypertension and diabetes  EXAM: CHEST  2 VIEW  COMPARISON:  Portable chest x-ray of March 15, 2014  FINDINGS: The film was obtained in a lordotic fashion. In addition the lungs are hypoinflated. There is no focal infiltrate. The cardiac silhouette is enlarged. The pulmonary vascularity is not engorged. There is no pleural effusion. There is mild loss of height of the body of L1.  IMPRESSION: The stomach study limited technically due to positioning and hypoinflation. No acute cardiopulmonary abnormality is demonstrated.   Electronically Signed   By: David  Martinique   On: 03/27/2014 17:21   Ct Head Wo Contrast  03/27/2014   CLINICAL DATA:  Altered mental status.  EXAM: CT HEAD WITHOUT CONTRAST  TECHNIQUE: Contiguous axial images were obtained from the base of the skull through the vertex without intravenous contrast.  COMPARISON:  Head CT scan 03/15/2014.  FINDINGS: Again seen is some cortical atrophy and chronic microvascular ischemic change. No evidence of acute abnormality including hemorrhage, infarct, mass lesion, mass effect, midline shift or abnormal extra-axial fluid collection. No hydrocephalus or pneumocephalus. The calvarium is intact.  IMPRESSION: No acute abnormality.   Electronically Signed   By: Inge Rise M.D.   On: 03/27/2014 19:30    Chart has been reviewed  Assessment/Plan  79 year old gentleman with history of NASH   liver cirrhosis presents with worsening hepatic encephalopathy  Present on Admission:  . Hepatic encephalopathy -we'll increase dose of fracture wasn't titrate as needed continue rifaximin follow clinically  . HTN (hypertension) continue home medications  . Liver cirrhosis secondary to NASH - patient was supposed to follow-up with Dr. Ernest Haber after his discharge.  Currently appears to be stable  . Thrombocytopenia stable avoid Lovenox  . Altered mental status most likely central hepatic encephalopathy. Given the low-grade fever well continue to monitor to see if patient declares himself at this point is no evidence of infection   Prophylaxis: SCD, Protonix  CODE STATUS:  FULL CODE    Other plan as per orders.  I have spent a total of 55 min on this admission  Zoeie Ritter 03/27/2014, 9:20 PM  [facility]  Pager [phone]   after 2 AM please page floor coverage PA If 7AM-7PM, please contact the day team taking care of the patient  [email or web address]  Password [email or web address]

## 2014-03-28 DIAGNOSIS — D696 Thrombocytopenia, unspecified: Secondary | ICD-10-CM

## 2014-03-28 LAB — GLUCOSE, CAPILLARY
Glucose-Capillary: 106 mg/dL — ABNORMAL HIGH (ref 70–99)
Glucose-Capillary: 96 mg/dL (ref 70–99)
Glucose-Capillary: 129 mg/dL — ABNORMAL HIGH (ref 70–99)
Glucose-Capillary: 117 mg/dL — ABNORMAL HIGH (ref 70–99)

## 2014-03-28 LAB — COMPREHENSIVE METABOLIC PANEL
ALT: 33 U/L (ref 0–53)
AST: 73 U/L — AB (ref 0–37)
Albumin: 1.9 g/dL — ABNORMAL LOW (ref 3.5–5.2)
Alkaline Phosphatase: 116 U/L (ref 39–117)
Anion gap: 8 (ref 5–15)
BUN: 15 mg/dL (ref 6–23)
CHLORIDE: 105 mmol/L (ref 96–112)
CO2: 26 mmol/L (ref 19–32)
Calcium: 8.4 mg/dL (ref 8.4–10.5)
Creatinine, Ser: 1.22 mg/dL (ref 0.50–1.35)
GFR, EST AFRICAN AMERICAN: 65 mL/min — AB (ref >90)
GFR, EST NON AFRICAN AMERICAN: 56 mL/min — AB (ref >90)
Glucose, Bld: 121 mg/dL — ABNORMAL HIGH (ref 70–99)
POTASSIUM: 3.8 mmol/L (ref 3.5–5.1)
SODIUM: 139 mmol/L (ref 135–145)
TOTAL PROTEIN: 6.2 g/dL (ref 6.0–8.3)
Total Bilirubin: 2.5 mg/dL — ABNORMAL HIGH (ref 0.3–1.2)

## 2014-03-28 LAB — CBC
HCT: 30.1 % — ABNORMAL LOW (ref 39.0–52.0)
Hemoglobin: 10 g/dL — ABNORMAL LOW (ref 13.0–17.0)
MCH: 32.5 pg (ref 26.0–34.0)
MCHC: 33.2 g/dL (ref 30.0–36.0)
MCV: 97.7 fL (ref 78.0–100.0)
PLATELETS: 61 10*3/uL — AB (ref 150–400)
RBC: 3.08 MIL/uL — AB (ref 4.22–5.81)
RDW: 17.2 % — AB (ref 11.5–15.5)
WBC: 3.2 10*3/uL — ABNORMAL LOW (ref 4.0–10.5)

## 2014-03-28 LAB — PHOSPHORUS: PHOSPHORUS: 2.9 mg/dL (ref 2.3–4.6)

## 2014-03-28 LAB — TSH: TSH: 5.746 u[IU]/mL — ABNORMAL HIGH (ref 0.350–4.500)

## 2014-03-28 LAB — MAGNESIUM: Magnesium: 1.8 mg/dL (ref 1.5–2.5)

## 2014-03-28 LAB — AMMONIA: AMMONIA: 69 umol/L — AB (ref 11–32)

## 2014-03-28 MED ORDER — NYSTATIN 100000 UNIT/GM EX POWD
Freq: Two times a day (BID) | CUTANEOUS | Status: DC
  Administered 2014-03-28 – 2014-03-31 (×6): via TOPICAL
  Filled 2014-03-28: qty 15

## 2014-03-28 NOTE — Progress Notes (Signed)
Utilization review completed.  

## 2014-03-28 NOTE — Progress Notes (Signed)
[facility] PROGRESS NOTE  GARIN MATA KCL:[id number] DOB: 30-Nov-1935 DOA: 03/27/2014  PCP: Shirline Frees, MD  Brief HPI: 79 year old Caucasian male with a past medical history of [name] with liver cirrhosis, diabetes, hypertension, sleep apnea, who was brought into the hospital due to worsening confusion. He was found to have an elevated ammonia level. He was admitted for further management of hepatic encephalopathy.  Past medical history:  Past Medical History  Diagnosis Date  . NASH (nonalcoholic steatohepatitis)   . Diabetes mellitus without complication   . Kidney disease   . Hypertension   . GERD (gastroesophageal reflux disease)     esophageal varices  . Depression   . Anxiety   . Sleep apnea     use CPAP-14  . Anemia   . Esophageal varices     Consultants: None  Procedures: None  Antibiotics: None  Subjective: Patient denies any pain. No nausea, vomiting. He feels more lucid this morning. His wife is at the bedside. She thinks that he is not as confused as he was yesterday.  Objective: Vital Signs  Filed Vitals:   03/27/14 2215 03/27/14 2230 03/27/14 2340 03/28/14 0500  BP: 94/28 97/57 127/55 102/86  Pulse: 65 97 54 76  Temp:   99 F (37.2 C) 98 F (36.7 C)  TempSrc:   Oral Oral  Resp: 25 23 20 20   Height:   5\' 7"  (1.702 m)   SpO2: 97% 91% 93% 97%   No intake or output data in the 24 hours ending 03/28/14 1040 There were no vitals filed for this visit.  General appearance: alert, cooperative, appears stated age and no distress Resp: clear to auscultation bilaterally Cardio: regular rate and rhythm, S1, S2 normal, systolic murmur: early systolic 2/6, crescendo at apex, no click and no rub GI: soft, non-tender; bowel sounds normal; no masses,  no organomegaly Extremities: extremities normal, atraumatic, no cyanosis or edema Skin: Skin color, texture, turgor normal. No rashes or lesions Neurologic: He is alert. Took some time answering my  questions. He knew the place. He was able to tell me the year, the month. He had some problems telling me the date. He could identify his wife. He could name the president. No facial asymmetry. Motor strength is equal bilateral upper and lower extremity 5 out of 5.  Lab Results:  Basic Metabolic Panel:  Recent Labs Lab 03/27/14 1609 03/28/14 0335  NA 136 139  K 4.2 3.8  CL 102 105  CO2 26 26  GLUCOSE 138* 121*  BUN 15 15  CREATININE 1.23 1.22  CALCIUM 8.6 8.4  MG  --  1.8  PHOS  --  2.9   Liver Function Tests:  Recent Labs Lab 03/27/14 1609 03/28/14 0335  AST 81* 73*  ALT 39 33  ALKPHOS 134* 116  BILITOT 2.7* 2.5*  PROT 7.0 6.2  ALBUMIN 2.0* 1.9*    Recent Labs Lab 03/27/14 1750  AMMONIA 95*   CBC:  Recent Labs Lab 03/27/14 1609 03/28/14 0335  WBC 3.6* 3.2*  HGB 10.7* 10.0*  HCT 31.5* 30.1*  MCV 96.9 97.7  PLT 64* 61*   CBG:  Recent Labs Lab 03/27/14 1628 03/28/14 0004 03/28/14 0637  GLUCAP 126* 106* 96     Studies/Results: Dg Chest 2 View  03/27/2014   CLINICAL DATA:  Altered mental status, history of hypertension and diabetes  EXAM: CHEST  2 VIEW  COMPARISON:  Portable chest x-ray of March 15, 2014  FINDINGS: The film was obtained  in a lordotic fashion. In addition the lungs are hypoinflated. There is no focal infiltrate. The cardiac silhouette is enlarged. The pulmonary vascularity is not engorged. There is no pleural effusion. There is mild loss of height of the body of L1.  IMPRESSION: The stomach study limited technically due to positioning and hypoinflation. No acute cardiopulmonary abnormality is demonstrated.   Electronically Signed   By: David  Martinique   On: 03/27/2014 17:21   Ct Head Wo Contrast  03/27/2014   CLINICAL DATA:  Altered mental status.  EXAM: CT HEAD WITHOUT CONTRAST  TECHNIQUE: Contiguous axial images were obtained from the base of the skull through the vertex without intravenous contrast.  COMPARISON:  Head CT scan 03/15/2014.   FINDINGS: Again seen is some cortical atrophy and chronic microvascular ischemic change. No evidence of acute abnormality including hemorrhage, infarct, mass lesion, mass effect, midline shift or abnormal extra-axial fluid collection. No hydrocephalus or pneumocephalus. The calvarium is intact.  IMPRESSION: No acute abnormality.   Electronically Signed   By: Inge Rise M.D.   On: 03/27/2014 19:30    Medications:  Scheduled: . aMILoride  5 mg Oral Daily  . darifenacin  15 mg Oral Daily  . docusate sodium  100 mg Oral BID  . ferrous sulfate  325 mg Oral q morning - 10a  . furosemide  40 mg Oral Daily  . insulin aspart  0-5 Units Subcutaneous QHS  . insulin aspart  0-9 Units Subcutaneous TID WC  . lactulose  40 g Oral TID  . nystatin   Topical BID  . pantoprazole  40 mg Oral Daily  . propranolol  10 mg Oral BID  . rifaximin  550 mg Oral BID  . senna  1 tablet Oral BID  . sodium chloride  3 mL Intravenous Q12H  . sodium chloride  3 mL Intravenous Q12H  . venlafaxine  75 mg Oral BID   Continuous:  WKG:SUPJSR chloride, bisacodyl, ondansetron **OR** ondansetron (ZOFRAN) IV, polyethylene glycol, sodium chloride  Assessment/Plan:  Active Problems:   Hepatic encephalopathy   Diabetes mellitus   HTN (hypertension)   Liver cirrhosis secondary to NASH   Thrombocytopenia   Altered mental status    Hepatic encephalopathy Seems more lucid today per wife. Continue with lactulose and Xifaxan. Ammonia level from this morning is still pending. Reorient daily. Mobilize.  History of NASH with liver cirrhosis He follows with Dr. Cristina Gong with [facility] gastroenterology. Currently stable except as noted above. He is on diuretics which will be continued. He is on beta blocker.  History of essential hypertension Blood pressure is well controlled. Continue current medications.  Chronic Thrombocytopenia secondary to liver cirrhosis Comes is stable. No overt bleeding. No anticoagulation.  Type  2 diabetes mellitus Currently on sliding scale coverage. CBGs are reasonably well controlled.  DVT Prophylaxis: SCDs    Code Status: Full code  Family Communication: Discussed with the patient and his wife  Disposition Plan: Seems to be improving. Continue current treatment strategy. Involve PT and OT. He was admitted from a skilled nursing facility.    LOS: 1 day   [facility] Pager [phone] 03/28/2014, 10:40 AM  If 7PM-7AM, please contact night-coverage at [email or web address], password [email or web address]

## 2014-03-28 NOTE — Progress Notes (Signed)
03/27/2014 11:38 PM The patient arrived from the ED with the wife on a stretcher. The patient is alert and oriented and stable. Lupita Dawn

## 2014-03-28 NOTE — Progress Notes (Signed)
03/28/2014 6:48 AM According to pts wife he has been making jerking movements in his sleep all night. I did see him doing this and readjusted his Cpap mask from home and let the head of the bed down and he stopped for awhile. The pt has recently had his teeth pulled, all of them and I think that maybe the mask needs to be refitted. The pts wife just called again and said that he was doing this again. Will try to readjust Cpap mask again and reposition the pt. Garen Grams

## 2014-03-28 NOTE — Evaluation (Signed)
Physical Therapy Evaluation Patient Details Name: [name] MRN: [id number] DOB: [birth year] Today's Date: 03/28/2014   History of Present Illness  79 year old Caucasian male with a past medical history of [name] with liver cirrhosis, diabetes, hypertension, sleep apnea, who was brought into the hospital due to worsening confusion. He was found to have an elevated ammonia level. He was admitted for further management of hepatic encephalopathy.  Clinical Impression  Pt admitted with the above diagnosis. Pt currently with functional limitations due to the deficits listed below (see PT Problem List). Patient with intermittent balance loss requiring min assist while ambulating. Improves with stability from a rolling walker. Anticipate he will progress functionally as his medical status improves and will update discharge recommendations as appropriate. Pt will benefit from skilled PT to increase their independence and safety with mobility to allow discharge to the venue listed below.       Follow Up Recommendations SNF    Equipment Recommendations  None recommended by PT    Recommendations for Other Services       Precautions / Restrictions Precautions Precautions: Fall Restrictions Weight Bearing Restrictions: No      Mobility  Bed Mobility               General bed mobility comments: In chair at start of therapy  Transfers Overall transfer level: Needs assistance Equipment used: None Transfers: Sit to/from Stand Sit to Stand: Min assist         General transfer comment: Min assist for balance upon standing. VC for hand placement.  Ambulation/Gait Ambulation/Gait assistance: Min assist Ambulation Distance (Feet): 125 Feet Assistive device: Rolling walker (2 wheeled);None Gait Pattern/deviations: Step-through pattern;Decreased stride length;Scissoring;Trunk flexed;Drifts right/left;Staggering right Gait velocity: decreased   General Gait Details: Ambulated  majority of bout without a rolling walker. Required min assist on several occasions due to loss of balance with LLE scissoring over RLE. Educated on safe DME use with a rolling walker and this improves stability.   Stairs            Wheelchair Mobility    Modified Rankin (Stroke Patients Only)       Balance Overall balance assessment: Needs assistance Sitting-balance support: No upper extremity supported;Feet supported Sitting balance-Leahy Scale: Good     Standing balance support: No upper extremity supported;During functional activity Standing balance-Leahy Scale: Fair                               Pertinent Vitals/Pain Pain Assessment: No/denies pain  HR 75 ambulating    Home Living Family/patient expects to be discharged to:: Skilled nursing facility Living Arrangements:  (From SNF) Available Help at Discharge: [place] Type of Home: House Home Access: Stairs to enter   Entrance Stairs-Number of Steps: 4   Home Equipment: Walker - standard;Bedside commode Additional Comments: poor historian, some information taken from previous visit    Prior Function Level of Independence: Needs assistance         Comments: Pt is poor historian, states he stayed at rehab.     Hand Dominance   Dominant Hand: Right    Extremity/Trunk Assessment   Upper Extremity Assessment: Defer to OT evaluation           Lower Extremity Assessment: Overall WFL for tasks assessed         Communication   Communication: HOH  Cognition Arousal/Alertness: Awake/alert Behavior During Therapy: WFL for tasks assessed/performed Overall Cognitive Status:  No family/caregiver present to determine baseline cognitive functioning       Memory: Decreased short-term memory              General Comments General comments (skin integrity, edema, etc.): Speaks frequently about off topic subjects. Oriented to place but unsure of why he is in the  hospital. Reoriented.    Exercises General Exercises - Lower Extremity Ankle Circles/Pumps: AROM;Both;10 reps;Seated      Assessment/Plan    PT Assessment Patient needs continued PT services  PT Diagnosis Difficulty walking;Abnormality of gait   PT Problem List Decreased activity tolerance;Decreased balance;Decreased mobility;Decreased cognition;Decreased knowledge of use of DME  PT Treatment Interventions DME instruction;Gait training;Functional mobility training;Therapeutic activities;Therapeutic exercise;Balance training;Neuromuscular re-education;Cognitive remediation;Patient/family education   PT Goals (Current goals can be found in the Care Plan section) Acute Rehab PT Goals Patient Stated Goal: None stated PT Goal Formulation: With patient Time For Goal Achievement: 04/11/14 Potential to Achieve Goals: Good    Frequency Min 2X/week   Barriers to discharge        Co-evaluation               End of Session Equipment Utilized During Treatment: Gait belt Activity Tolerance: Patient tolerated treatment well Patient left: in chair;with call bell/phone within reach;with SCD's reapplied Nurse Communication: Mobility status         Time: 1353-1409 PT Time Calculation (min) (ACUTE ONLY): 16 min   Charges:   PT Evaluation $Initial PT Evaluation Tier I: 1 Procedure     PT G CodesEllouise Newer 03/28/2014, 3:47 PM  Elayne Snare, [place]

## 2014-03-28 NOTE — Clinical Social Work Psychosocial (Signed)
Clinical Social Work Department BRIEF PSYCHOSOCIAL ASSESSMENT 03/28/2014  Patient:  CAIDE, CAMPI     Account Number:  [id number]     Admit date:  03/27/2014  Clinical Social Worker:  Domenica Reamer, CLINICAL SOCIAL WORKER  Date/Time:  03/28/2014 01:55 PM  Referred by:  Physician  Date Referred:  03/28/2014 Referred for  ALF Placement  SNF Placement   Other Referral:   Interview type:  Family Other interview type:   CSW met with patient and patients spouse    PSYCHOSOCIAL DATA Living Status:  FACILITY Admitted from facility:  [facility] Level of care:  Assisted Living Primary support name:  Silva Bandy Primary support relationship to patient:  SPOUSE Degree of support available:   According to pt spouse pt has high level of support at home- pt spouse has taken care of the patient for over a month with the help of home health but she states she is burnt out at this time and does not feel as if she can offer sufficient support    CURRENT CONCERNS Current Concerns  Post-Acute Placement   Other Concerns:    SOCIAL WORK ASSESSMENT / PLAN CSW spoke with patient and pt wife at bedside.    Patient presented as pleasantly confused and would often interject into the conversation with off topic statements- other times he would say somewhat relevant facts.    CSW spoke primarily with pt wife who states that patient has been at [facility] since 3/11- Pt wife states that pt was at [facility] prior to going to [facility] for what she describes as a "respite program" where he was supposed to stay for 30 days for private pay and recover.   Pt spouse is disatisfied with the care received at the facility and is unsure if she wants pt to return there thought pt was agreeable to return despite confusion.    Pt spouse interested in other options- CSW explained role of CSW in finding placement and informed pt spouse that CSW would get PT/OT involved to determine  if pt would qualify for SNF- if not then the option would be to go back to finish out 30 days with [facility] or to go home with home health services again.    CSW will continue to follow   Assessment/plan status:  Psychosocial Support/Ongoing Assessment of Needs Other assessment/ plan:   FL2   Information/referral to community resources:    PATIENT'S/FAMILY'S RESPONSE TO PLAN OF CARE: Patients wife is aggreeable to placement and expressed anxiety at the idea of the patient returning home to soon.       Domenica Reamer, [place] Social Worker [phone]

## 2014-03-29 ENCOUNTER — Inpatient Hospital Stay (HOSPITAL_COMMUNITY): Payer: BC Managed Care – PPO

## 2014-03-29 DIAGNOSIS — K5900 Constipation, unspecified: Secondary | ICD-10-CM

## 2014-03-29 DIAGNOSIS — N182 Chronic kidney disease, stage 2 (mild): Secondary | ICD-10-CM | POA: Diagnosis present

## 2014-03-29 DIAGNOSIS — I5032 Chronic diastolic (congestive) heart failure: Secondary | ICD-10-CM | POA: Diagnosis present

## 2014-03-29 LAB — GLUCOSE, CAPILLARY
GLUCOSE-CAPILLARY: 117 mg/dL — AB (ref 70–99)
Glucose-Capillary: 112 mg/dL — ABNORMAL HIGH (ref 70–99)
Glucose-Capillary: 122 mg/dL — ABNORMAL HIGH (ref 70–99)
Glucose-Capillary: 126 mg/dL — ABNORMAL HIGH (ref 70–99)
Glucose-Capillary: 111 mg/dL — ABNORMAL HIGH (ref 70–99)
Glucose-Capillary: 99 mg/dL (ref 70–99)

## 2014-03-29 LAB — COMPREHENSIVE METABOLIC PANEL
ALK PHOS: 116 U/L (ref 39–117)
ALT: 36 U/L (ref 0–53)
AST: 78 U/L — AB (ref 0–37)
Albumin: 2 g/dL — ABNORMAL LOW (ref 3.5–5.2)
Anion gap: 7 (ref 5–15)
BILIRUBIN TOTAL: 3.8 mg/dL — AB (ref 0.3–1.2)
BUN: 15 mg/dL (ref 6–23)
CO2: 25 mmol/L (ref 19–32)
CREATININE: 1.27 mg/dL (ref 0.50–1.35)
Calcium: 8.7 mg/dL (ref 8.4–10.5)
Chloride: 108 mmol/L (ref 96–112)
GFR calc Af Amer: 62 mL/min — ABNORMAL LOW (ref >90)
GFR, EST NON AFRICAN AMERICAN: 53 mL/min — AB (ref >90)
Glucose, Bld: 120 mg/dL — ABNORMAL HIGH (ref 70–99)
Potassium: 3.7 mmol/L (ref 3.5–5.1)
Sodium: 140 mmol/L (ref 135–145)
Total Protein: 6.7 g/dL (ref 6.0–8.3)

## 2014-03-29 LAB — CBC
HEMATOCRIT: 31.4 % — AB (ref 39.0–52.0)
Hemoglobin: 10.5 g/dL — ABNORMAL LOW (ref 13.0–17.0)
MCH: 32.9 pg (ref 26.0–34.0)
MCHC: 33.4 g/dL (ref 30.0–36.0)
MCV: 98.4 fL (ref 78.0–100.0)
PLATELETS: 58 10*3/uL — AB (ref 150–400)
RBC: 3.19 MIL/uL — ABNORMAL LOW (ref 4.22–5.81)
RDW: 17.5 % — ABNORMAL HIGH (ref 11.5–15.5)
WBC: 3.8 10*3/uL — AB (ref 4.0–10.5)

## 2014-03-29 LAB — HEMOGLOBIN A1C
Hgb A1c MFr Bld: 5.3 % (ref 4.8–5.6)
MEAN PLASMA GLUCOSE: 105 mg/dL

## 2014-03-29 LAB — AMMONIA: Ammonia: 86 umol/L — ABNORMAL HIGH (ref 11–32)

## 2014-03-29 MED ORDER — LACTULOSE 10 GM/15ML PO SOLN
40.0000 g | Freq: Four times a day (QID) | ORAL | Status: DC
  Administered 2014-03-29 – 2014-03-30 (×5): 40 g via ORAL
  Filled 2014-03-29 (×11): qty 60

## 2014-03-29 MED ORDER — INSULIN ASPART 100 UNIT/ML ~~LOC~~ SOLN: 0.0000 [IU] | Freq: Three times a day (TID) | SUBCUTANEOUS | Status: DC

## 2014-03-29 MED ORDER — LACTULOSE 10 GM/15ML PO SOLN
60.0000 g | Freq: Four times a day (QID) | ORAL | Status: DC
  Filled 2014-03-29 (×3): qty 90

## 2014-03-29 MED ORDER — BISACODYL 10 MG RE SUPP
10.0000 mg | Freq: Once | RECTAL | Status: AC
  Administered 2014-03-29: 10 mg via RECTAL

## 2014-03-29 NOTE — Progress Notes (Signed)
PROGRESS NOTE  [name] HYI:[id number] DOB: 06-05-1935 DOA: 03/27/2014 PCP: Shirline Frees, MD  HPI/Recap of past 24 hours: Patient is a 79 year old male with past mental history of [name] cirrhosis as well as diabetes and hypertension who was brought in for worsening confusion and felt to be secondary to hepatic encephalopathy.  Patient initially responded to lactulose and had some improvement in mentation on 3/15, but was more acutely confused today, 3/16. Ammonia level checked and found to be increasing.  Patient seen this morning. He is interactive, but then starts to confabulate and is confused. Initially refused lactulose, but with his family, were able to convince him to take more  Assessment/Plan: Principal Problem:   Hepatic encephalopathy secondary to liver cirrhosis from [place]: Suspect some issue may be patient's constipation, confirmed by abdominal x-ray. We're able to convince him to orally take more lactulose and Xifaximin.  We'll use suppository to further clean out his bowels. Monitor platelet counts Active Problems:   Diabetes mellitus: CBG stable. Treating only with customized sliding scale if sugars get above 300   HTN (hypertension): Stable, continue home meds, patient has borderline low blood pressure   Liver cirrhosis secondary to NASH: Noted elevation in bilirubin, referral labs in the morning. GI consulted   Thrombocytopenia: No active bleeding, monitoring platelet counts. Secondary to liver cirrhosis.    CKD (chronic kidney disease) stage 2, GFR 60-89 ml/min   Chronic diastolic heart failure: Does not look to be acutely volume overloaded, follow BNP   Constipation: See above   Code Status: Full code  Family Communication: Wife and Daughter at the bedside  Disposition Plan: Was from assisted living, most likely will need skilled nursing given higher levels of care and  monitoring   Consultants:  Gastroenterology  Procedures:  None  Antibiotics:  None   Objective: BP 114/65 mmHg  Pulse 61  Temp(Src) 98 F (36.7 C) (Oral)  Resp 17  Ht 5\' 7"  (1.702 m)  SpO2 98%  Intake/Output Summary (Last 24 hours) at 03/29/14 1758 Last data filed at 03/29/14 1300  Gross per 24 hour  Intake    123 ml  Output      0 ml  Net    123 ml   There were no vitals filed for this visit.  Exam:   General:  Alert and oriented 1  Cardiovascular: Regular rate and rhythm, S1-S2  Respiratory: Clear to auscultation bilaterally  Abdomen: Soft, mild abdominal distention, nontender, hypoactive bowel sounds  Musculoskeletal: No clubbing or cyanosis, trace pitting edema, SCDs in place   Data Reviewed: Basic Metabolic Panel:  Recent Labs Lab 03/27/14 1609 03/28/14 0335 03/29/14 0436  NA 136 139 140  K 4.2 3.8 3.7  CL 102 105 108  CO2 26 26 25   GLUCOSE 138* 121* 120*  BUN 15 15 15   CREATININE 1.23 1.22 1.27  CALCIUM 8.6 8.4 8.7  MG  --  1.8  --   PHOS  --  2.9  --    Liver Function Tests:  Recent Labs Lab 03/27/14 1609 03/28/14 0335 03/29/14 0436  AST 81* 73* 78*  ALT 39 33 36  ALKPHOS 134* 116 116  BILITOT 2.7* 2.5* 3.8*  PROT 7.0 6.2 6.7  ALBUMIN 2.0* 1.9* 2.0*   No results for input(s): LIPASE, AMYLASE in the last 168 hours.  Recent Labs Lab 03/27/14 1750 03/28/14 1242 03/29/14 0400  AMMONIA 95* 69* 86*   CBC:  Recent Labs Lab 03/27/14 1609 03/28/14 0335 03/29/14 0436  WBC  3.6* 3.2* 3.8*  HGB 10.7* 10.0* 10.5*  HCT 31.5* 30.1* 31.4*  MCV 96.9 97.7 98.4  PLT 64* 61* 58*   Cardiac Enzymes:   No results for input(s): CKTOTAL, CKMB, CKMBINDEX, TROPONINI in the last 168 hours. BNP (last 3 results)  Recent Labs  02/15/14 1614  BNP 202.5*    ProBNP (last 3 results) No results for input(s): PROBNP in the last 8760 hours.  CBG:  Recent Labs Lab 03/28/14 2133 03/29/14 0612 03/29/14 0809 03/29/14 1129  03/29/14 1612  GLUCAP 112* 117* 122* 126* 111*    No results found for this or any previous visit (from the past 240 hour(s)).   Studies: No results found.  Scheduled Meds: . aMILoride  5 mg Oral Daily  . bisacodyl  10 mg Rectal Once  . darifenacin  15 mg Oral Daily  . docusate sodium  100 mg Oral BID  . ferrous sulfate  325 mg Oral q morning - 10a  . furosemide  40 mg Oral Daily  . insulin aspart  0-2 Units Subcutaneous TID WC  . lactulose  40 g Oral QID  . nystatin   Topical BID  . pantoprazole  40 mg Oral Daily  . propranolol  10 mg Oral BID  . rifaximin  550 mg Oral BID  . sodium chloride  3 mL Intravenous Q12H  . sodium chloride  3 mL Intravenous Q12H  . venlafaxine  75 mg Oral BID    Continuous Infusions:    Time spent: 35 minutes  [place] [facility] Pager [phone]. If 7PM-7AM, please contact night-coverage at [email or web address], password [facility] 03/29/2014, 5:58 PM  LOS: 2 days

## 2014-03-29 NOTE — Significant Event (Signed)
Rapid Response Event Note  Overview:  Called to assist with patient with decreased LOC Time Called: 0813 Arrival Time: 0815 Event Type: Neurologic  Initial Focused Assessment:  On arrival patient supine in bed - warm and dry - resps reg and unlabored - bil BS present clear - will move to DPS - pupils 59mm equal and reactive - good muscle tone x 4 extremities - oral mucosa dry- abd large soft - moans slightly with palp to abd - BP 138/89 HR 76 NSR - RR 20 O2 sats 97% on 2 liter nasal cannula.  CBG 123.  Skin hot to touch - slight yellow tone.  Rectal temp 98.4.  Has OSA - ? How long CPAP was worn last night.  RN reports that patient had increasing confusion overnight per report.  NT reports patient was alert and talking few minutes prior to decreased LOC.   Interventions: Repositioned in bed - continue to stimulate - patient becoming more alert - moving all extremities to pain - becoming purposeful.  HOB elevated.  Attempt to get ABG - with minimal skin prick from needle patient became combative - screaming no and hitting at staff - test aborted.  Patient becoming more responsive - talking with mumbled speech - progressing to clear speech- continual talking to self - will occassionally talk to staff and respond to questions - will eat ice clearly stating he" likes ice but you know I have no teeth - had them all pulled because they were rotten."  Continues to talk non-stop with eyes closed.  RN Vicente Males reports patient is having some of the same thoughts he expressed yesterday.  BP remains stable 147/70 HR 70 NSR now RA with sats 100% - resps regular and unlabored.  RN Vicente Males speaking with Dr. Maryland Pink - orders for lactulose.  Staff to call as needed.    Event Summary: Name of Physician Notified: Dr. Gevena Barre at  (prior to arrival RRT)    at    Outcome: Stayed in room and stabalized  Event End Time: 0900  Quin Hoop

## 2014-03-29 NOTE — Evaluation (Signed)
Occupational Therapy Evaluation Patient Details Name: [name] MRN: [id number] DOB: 05/25/1935 Today's Date: 03/29/2014    History of Present Illness 79 year old Caucasian male with a past medical history of [name] with liver cirrhosis, diabetes, hypertension, sleep apnea, who was brought into the hospital due to worsening confusion. He was found to have an elevated ammonia level. He was admitted for further management of hepatic encephalopathy.   Clinical Impression   Pt with limited ability to participate in evaluation due to confusion and incessant talking. At this point, pt requires total assist for all ADL and 2 person assist for bed level mobility.  He was actively resistant to sitting EOB and did not follow commands.  Plan is for placement in SNF.  Wife overwhelmed with situation and decision making process.  Will defer further OT to SNF.    Follow Up Recommendations  SNF;Supervision/Assistance - 24 hour    Equipment Recommendations       Recommendations for Other Services       Precautions / Restrictions Precautions Precautions: Fall Restrictions Weight Bearing Restrictions: No      Mobility Bed Mobility               General bed mobility comments: total assist to roll, +2 assist to pull up in bed  Transfers                      Balance                                            ADL                                         General ADL Comments: total assist currently due to cognition, level of awareness     Vision Additional Comments: pt only opening his eyes briefly   Perception     Praxis      Pertinent Vitals/Pain Pain Assessment: Faces Faces Pain Scale: No hurt     Hand Dominance Right   Extremity/Trunk Assessment Upper Extremity Assessment Upper Extremity Assessment: Overall WFL for tasks assessed   Lower Extremity Assessment Lower Extremity Assessment: Defer to PT evaluation        Communication Communication Communication: HOH   Cognition Arousal/Alertness: Awake/alert Behavior During Therapy:  (pt incessantly talking, can redirect briefly) Overall Cognitive Status: Impaired/Different from baseline Area of Impairment: Following commands;Memory;Safety/judgement;Awareness;Attention;Orientation Orientation Level: Disoriented to;Place;Time;Situation Current Attention Level: Focused Memory: Decreased short-term memory (pt with baseline mild cognitive impairment)   Safety/Judgement: Decreased awareness of safety;Decreased awareness of deficits Awareness:  (pre intellectual)       General Comments       Exercises       Shoulder Instructions      Home Living Family/patient expects to be discharged to:: Skilled nursing facility                                 Additional Comments: Wife stated pt walked with a gait belt and required assistance for bathing, dressing and toileting.  He could self feed.      Prior Functioning/Environment               OT Diagnosis: Generalized weakness;Cognitive  deficits;Altered mental status   OT Problem List:     OT Treatment/Interventions:      OT Goals(Current goals can be found in the care plan section) Acute Rehab OT Goals Patient Stated Goal: wife wants better control of pt's ammonia level OT Goal Formulation: With family  OT Frequency:     Barriers to D/C:            Co-evaluation              End of Session    Activity Tolerance: Patient tolerated treatment well Patient left: in bed;with call bell/phone within reach;with family/visitor present   Time: 1020-1100 OT Time Calculation (min): 40 min Charges:  OT General Charges $OT Visit: 1 Procedure OT Evaluation $Initial OT Evaluation Tier I: 1 Procedure OT Treatments $Therapeutic Activity: 8-22 mins G-Codes:    Malka So 03/29/2014, 11:12 AM [phone]

## 2014-03-29 NOTE — Clinical Social Work Note (Signed)
CSW spoke with patients wife at bedside- patient has increased confusion today and was constantly speaking during the meeting.  Patient wife will review bed offers with daughter and inform CSW of decision.  Domenica Reamer, [place] Social Worker [phone]

## 2014-03-29 NOTE — Progress Notes (Signed)
Came to room nurse tech in room talking to pt, pt unresponsive, v/s stable per flow sheet, rapid response called, dr Maryland Pink called, labs ordered, blood sugar 122, pt arousable after lab tried to draw lab pt screamed out and respsonding to pain Cato Mulligan RN

## 2014-03-29 NOTE — Consult Note (Addendum)
Referring Provider: Dr. Maryland Pink Primary Care Physician:  Shirline Frees, MD Primary Gastroenterologist:  Dr. Cristina Gong  Reason for Consultation:  Hepatic Encephalopathy  HPI: [name] is a 79 y.o. male with a history of NASH cirrhosis admitted for worsening confusion and had been on Lactulose and Xifaxan in the past per his wife. She reports minimal BMs prior to admit. She reports onset of the confusion occurring after multiple teeth extractions in early March. Elevated ammonia level of 95 at admit. Wife reports he is a lot less confused now then he was last night. He is denies abdominal pain, nausea, or vomiting. Denies melena or hematochezia. Having headaches at times that his wife reports Dr. Maryland Pink is aware of.    Past Medical History  Diagnosis Date  . NASH (nonalcoholic steatohepatitis)   . Diabetes mellitus without complication   . Kidney disease   . Hypertension   . GERD (gastroesophageal reflux disease)     esophageal varices  . Depression   . Anxiety   . Sleep apnea     use CPAP-14  . Anemia   . Esophageal varices     Past Surgical History  Procedure Laterality Date  . Tonsillectomy    . Left arm surgery  1984    crush injury-pins  . Esophagogastroduodenoscopy (egd) with propofol N/A 11/18/2012    Procedure: ESOPHAGOGASTRODUODENOSCOPY (EGD) WITH PROPOFOL;  Surgeon: Cleotis Nipper, MD;  Location: [facility];  Service: Endoscopy;  Laterality: N/A;  . Colonoscopy with propofol N/A 11/18/2012    Procedure: COLONOSCOPY WITH PROPOFOL;  Surgeon: Cleotis Nipper, MD;  Location: [facility];  Service: Endoscopy;  Laterality: N/A;    Prior to Admission medications   Medication Sig Start Date End Date Taking? Authorizing Provider  aMILoride (MIDAMOR) 5 MG tablet Take 5 mg by mouth daily.   Yes Historical Provider, MD  cholecalciferol (VITAMIN D) 1000 UNITS tablet Take 1,000 Units by mouth every evening.    Yes Historical Provider, MD  clotrimazole-betamethasone  (LOTRISONE) cream Apply 1 application topically as needed (for rash).    Yes Historical Provider, MD  ferrous sulfate 325 (65 FE) MG tablet Take 325 mg by mouth every morning.    Yes Historical Provider, MD  furosemide (LASIX) 40 MG tablet Take 40 mg by mouth daily.    Yes Historical Provider, MD  lactulose (CHRONULAC) 10 GM/15ML solution Take 30 mLs (20 g total) by mouth 3 (three) times daily. Patient taking differently: Take 20-40 g by mouth 3 (three) times daily. Takes 60 ml's in morning, 30 ml's at lunch and 60 ml's in evening 02/18/14  Yes Orson Eva, MD  Multiple Vitamin (MULTIVITAMIN WITH MINERALS) TABS tablet Take 1 tablet by mouth every evening.    Yes Historical Provider, MD  omeprazole (PRILOSEC) 20 MG capsule Take 20 mg by mouth daily.   Yes Historical Provider, MD  PRESCRIPTION MEDICATION Apply 1 application topically daily as needed. Eucerin/Triamcinolone 0.1% apply for dry skin   Yes Historical Provider, MD  Probiotic Product (PROBIOTIC DAILY) CAPS Take 1 capsule by mouth every morning.    Yes Historical Provider, MD  propranolol (INDERAL) 10 MG tablet Take 10 mg by mouth 2 (two) times daily.   Yes Historical Provider, MD  rifaximin (XIFAXAN) 550 MG TABS tablet Take 550 mg by mouth 2 (two) times daily.   Yes Historical Provider, MD  solifenacin (VESICARE) 10 MG tablet Take 10 mg by mouth daily.   Yes Historical Provider, MD  venlafaxine (EFFEXOR) 75 MG tablet Take  75 mg by mouth 2 (two) times daily.   Yes Historical Provider, MD  insulin aspart protamine- aspart (NOVOLOG MIX 70/30) (70-30) 100 UNIT/ML injection Inject 30 Units into the skin 2 (two) times daily with a meal.    Historical Provider, MD    Scheduled Meds: . aMILoride  5 mg Oral Daily  . darifenacin  15 mg Oral Daily  . docusate sodium  100 mg Oral BID  . ferrous sulfate  325 mg Oral q morning - 10a  . furosemide  40 mg Oral Daily  . insulin aspart  0-2 Units Subcutaneous TID WC  . lactulose  40 g Oral QID  . nystatin    Topical BID  . pantoprazole  40 mg Oral Daily  . propranolol  10 mg Oral BID  . rifaximin  550 mg Oral BID  . sodium chloride  3 mL Intravenous Q12H  . sodium chloride  3 mL Intravenous Q12H  . venlafaxine  75 mg Oral BID   Continuous Infusions:  PRN Meds:.sodium chloride, bisacodyl, ondansetron **OR** ondansetron (ZOFRAN) IV, polyethylene glycol, sodium chloride  Allergies as of 03/27/2014 - Review Complete 03/27/2014  Allergen Reaction Noted  . Aldactone [spironolactone] Swelling 09/21/2012    Family History  Problem Relation Age of Onset  . Diabetes type II Mother   . Alcoholism Father   . Diabetes type II Sister     History   Social History  . Marital Status: Married    Spouse Name: N/A  . Number of Children: N/A  . Years of Education: N/A   Occupational History  . Not on file.   Social History Main Topics  . Smoking status: Never Smoker   . Smokeless tobacco: Not on file  . Alcohol Use: Yes     Comment: occ  . Drug Use: No  . Sexual Activity: Not on file   Other Topics Concern  . Not on file   Social History Narrative    Review of Systems: All negative except as stated above in HPI.  Physical Exam: Vital signs: Filed Vitals:   03/29/14 1400  BP: 114/65  Pulse: 61  Temp: 98 F (36.7 C)  Resp: 17   Last BM Date: 03/28/14 General:   Awake, Well-developed, well-nourished, pleasant and cooperative in NAD Neuro: alert, oriented X 3; no asterixis HEENT: anicteric Lungs:  Clear throughout to auscultation.   No wheezes, crackles, or rhonchi. No acute distress. Heart:  Regular rate and rhythm; no murmurs, clicks, rubs,  or gallops. Abdomen: soft, nontender, nondistended, +BS  Rectal:  Deferred Ext: no edema  GI:  Lab Results:  Recent Labs  03/27/14 1609 03/28/14 0335 03/29/14 0436  WBC 3.6* 3.2* 3.8*  HGB 10.7* 10.0* 10.5*  HCT 31.5* 30.1* 31.4*  PLT 64* 61* 58*   BMET  Recent Labs  03/27/14 1609 03/28/14 0335 03/29/14 0436  NA 136  139 140  K 4.2 3.8 3.7  CL 102 105 108  CO2 26 26 25   GLUCOSE 138* 121* 120*  BUN 15 15 15   CREATININE 1.23 1.22 1.27  CALCIUM 8.6 8.4 8.7   LFT  Recent Labs  03/29/14 0436  PROT 6.7  ALBUMIN 2.0*  AST 78*  ALT 36  ALKPHOS 116  BILITOT 3.8*   PT/INR No results for input(s): LABPROT, INR in the last 72 hours.   Studies/Results: Dg Chest 2 View  03/27/2014   CLINICAL DATA:  Altered mental status, history of hypertension and diabetes  EXAM: CHEST  2 VIEW  COMPARISON:  Portable chest x-ray of March 15, 2014  FINDINGS: The film was obtained in a lordotic fashion. In addition the lungs are hypoinflated. There is no focal infiltrate. The cardiac silhouette is enlarged. The pulmonary vascularity is not engorged. There is no pleural effusion. There is mild loss of height of the body of L1.  IMPRESSION: The stomach study limited technically due to positioning and hypoinflation. No acute cardiopulmonary abnormality is demonstrated.   Electronically Signed   By: David  Martinique   On: 03/27/2014 17:21   Ct Head Wo Contrast  03/27/2014   CLINICAL DATA:  Altered mental status.  EXAM: CT HEAD WITHOUT CONTRAST  TECHNIQUE: Contiguous axial images were obtained from the base of the skull through the vertex without intravenous contrast.  COMPARISON:  Head CT scan 03/15/2014.  FINDINGS: Again seen is some cortical atrophy and chronic microvascular ischemic change. No evidence of acute abnormality including hemorrhage, infarct, mass lesion, mass effect, midline shift or abnormal extra-axial fluid collection. No hydrocephalus or pneumocephalus. The calvarium is intact.  IMPRESSION: No acute abnormality.   Electronically Signed   By: Inge Rise M.D.   On: 03/27/2014 19:30    Impression/Plan: Hepatic Encephalopathy that clinically is improving on a higher dose and frequency of Lactulose and continued Rifaximin. I do not think an U/S-guided paracentesis is needed at this time. Abd Xray pending. Continue  supportive care. Low sodium diet. F/U with Dr. Cristina Gong in 3-4 weeks. Needs adequate bowel habit regimen as an outpatient where the Lactulose can be titrated for 2-3 soft stools per day. If constipation persisting, then may need to stop the iron supplementation. No new recs. Please call us back if needed, otherwise will sign off.    LOS: 2 days   Harahan C.  03/29/2014, 3:15 PM

## 2014-03-30 ENCOUNTER — Inpatient Hospital Stay (HOSPITAL_COMMUNITY): Payer: BC Managed Care – PPO

## 2014-03-30 LAB — CBC
HCT: 30.7 % — ABNORMAL LOW (ref 39.0–52.0)
Hemoglobin: 10.4 g/dL — ABNORMAL LOW (ref 13.0–17.0)
MCH: 33.5 pg (ref 26.0–34.0)
MCHC: 33.9 g/dL (ref 30.0–36.0)
MCV: 99 fL (ref 78.0–100.0)
PLATELETS: 54 10*3/uL — AB (ref 150–400)
RBC: 3.1 MIL/uL — ABNORMAL LOW (ref 4.22–5.81)
RDW: 17.8 % — ABNORMAL HIGH (ref 11.5–15.5)
WBC: 3 10*3/uL — AB (ref 4.0–10.5)

## 2014-03-30 LAB — COMPREHENSIVE METABOLIC PANEL
ALT: 34 U/L (ref 0–53)
ANION GAP: 11 (ref 5–15)
AST: 72 U/L — AB (ref 0–37)
Albumin: 1.9 g/dL — ABNORMAL LOW (ref 3.5–5.2)
Alkaline Phosphatase: 106 U/L (ref 39–117)
BUN: 14 mg/dL (ref 6–23)
CO2: 22 mmol/L (ref 19–32)
Calcium: 8.5 mg/dL (ref 8.4–10.5)
Chloride: 106 mmol/L (ref 96–112)
Creatinine, Ser: 1.26 mg/dL (ref 0.50–1.35)
GFR calc Af Amer: 62 mL/min — ABNORMAL LOW (ref >90)
GFR calc non Af Amer: 54 mL/min — ABNORMAL LOW (ref >90)
Glucose, Bld: 105 mg/dL — ABNORMAL HIGH (ref 70–99)
POTASSIUM: 3.5 mmol/L (ref 3.5–5.1)
Sodium: 139 mmol/L (ref 135–145)
Total Bilirubin: 3.7 mg/dL — ABNORMAL HIGH (ref 0.3–1.2)
Total Protein: 6.8 g/dL (ref 6.0–8.3)

## 2014-03-30 LAB — GLUCOSE, CAPILLARY
GLUCOSE-CAPILLARY: 96 mg/dL (ref 70–99)
Glucose-Capillary: 109 mg/dL — ABNORMAL HIGH (ref 70–99)
Glucose-Capillary: 108 mg/dL — ABNORMAL HIGH (ref 70–99)
Glucose-Capillary: 118 mg/dL — ABNORMAL HIGH (ref 70–99)

## 2014-03-30 LAB — AMMONIA: AMMONIA: 68 umol/L — AB (ref 11–32)

## 2014-03-30 MED ORDER — LACTULOSE ENEMA
300.0000 mL | Freq: Two times a day (BID) | ORAL | Status: DC
  Filled 2014-03-30 (×6): qty 300

## 2014-03-30 NOTE — Progress Notes (Signed)
Patient ID: [name], male   DOB: 10-Jan-1936, 79 y.o.   MRN: [id number] [facility] Progress Note  [name] 79 y.o. [birth year]   Subjective: Resting but arousable. Oriented X 3. Denies abdominal pain. Sitter at bedside. Patient reportedly did not sleep last night.  Objective: Vital signs in last 24 hours: Filed Vitals:   03/30/14 0553  BP: 104/40  Pulse: 66  Temp: 98.6 F (37 C)  Resp: 18    Physical Exam: Gen: lethargic, no acute distress CV: RRR Chest: CTA Bilaterally Abd: soft, nontender, nondistended Neuro: oriented X 3  Lab Results:  Recent Labs  03/28/14 0335 03/29/14 0436 03/30/14 0535  NA 139 140 139  K 3.8 3.7 3.5  CL 105 108 106  CO2 26 25 22   GLUCOSE 121* 120* 105*  BUN 15 15 14   CREATININE 1.22 1.27 1.26  CALCIUM 8.4 8.7 8.5  MG 1.8  --   --   PHOS 2.9  --   --     Recent Labs  03/29/14 0436 03/30/14 0535  AST 78* 72*  ALT 36 34  ALKPHOS 116 106  BILITOT 3.8* 3.7*  PROT 6.7 6.8  ALBUMIN 2.0* 1.9*    Recent Labs  03/29/14 0436 03/30/14 0535  WBC 3.8* 3.0*  HGB 10.5* 10.4*  HCT 31.4* 30.7*  MCV 98.4 99.0  PLT 58* 54*   No results for input(s): LABPROT, INR in the last 72 hours.    Assessment/Plan: 79 yo with resolving hepatic encephalopathy. TB recently has risen with minimal elevation of AST. Will do abd U/S to further evaluate. Continue Lactulose, Xifaxan. Supportive care.   Beulah C. 03/30/2014, 9:16 AM

## 2014-03-30 NOTE — Progress Notes (Signed)
Medicare Important Message given? YES  (If response is "NO", the following Medicare IM given date fields will be blank)  Date Medicare IM given: 03/30/14 Medicare IM given by:  Dahlia Client [employer]

## 2014-03-30 NOTE — Progress Notes (Signed)
PROGRESS NOTE  [name] DJM:[id number] DOB: 16-May-1935 DOA: 03/27/2014 PCP: Shirline Frees, MD  HPI/Recap of past 24 hours: Patient is a 79 year old male with past mental history of [name] cirrhosis as well as diabetes and hypertension who was brought in for worsening confusion and felt to be secondary to hepatic encephalopathy.  Patient initially responded to lactulose and had some improvement in mentation on 3/15, but was more acutely confused today, 3/16. Ammonia level checked and found to be increasing.  Patient has been intermittently refusing to take his lactulose and this morning was more sedated but by afternoon was quite confused again. Ammonia levels had improved from previous day, but patient himself is not much better.  He is very incoherent when asked about how he is doing  Assessment/Plan: Principal Problem:   Hepatic encephalopathy secondary to liver cirrhosis from [place]: Suspect some issue may be patient's constipation, confirmed by abdominal x-ray. We're able to convince him to orally take more lactulose and Xifaximin.  Monitor thrombocytopenia and will try lactulose enemas Active Problems:   Diabetes mellitus: CBG stable. Treating only with customized sliding scale if sugars get above 300   HTN (hypertension): Stable, continue home meds, patient has borderline low blood pressure   Liver cirrhosis secondary to NASH: Noted elevation in bilirubin, persistent again today. GI consulted. Getting abdominal ultrasound. Given overall decline, palliative care consult as well for goals of care. Plan is to meet with family tomorrow.   Thrombocytopenia: No active bleeding, monitoring platelet counts. Secondary to liver cirrhosis.    CKD (chronic kidney disease) stage 2, GFR 60-89 ml/min   Chronic diastolic heart failure: Does not look to be acutely volume overloaded, follow BNP   Constipation: See above   Code Status: Full code  Family Communication: Wife at the  bedside  Disposition Plan: Was from assisted living, most likely will need skilled nursing given higher levels of care and monitoring   Consultants:  Gastroenterology  Palliative care   Procedures:  None  Antibiotics:  None   Objective: BP 116/57 mmHg  Pulse 60  Temp(Src) 97.8 F (36.6 C) (Oral)  Resp 17  Ht 5\' 7"  (1.702 m)  SpO2 99%  Intake/Output Summary (Last 24 hours) at 03/30/14 1635 Last data filed at 03/30/14 0852  Gross per 24 hour  Intake    480 ml  Output      0 ml  Net    480 ml   There were no vitals filed for this visit.  Exam:  exam mostly unchanged   General:  Alert and oriented 1, easily agitated   Cardiovascular: Regular rate and rhythm, S1-S2  Respiratory: Clear to auscultation bilaterally  Abdomen: Soft, mild abdominal distention, nontender, hypoactive bowel sounds  Musculoskeletal: No clubbing or cyanosis, trace pitting edema, SCDs in place   Data Reviewed: Basic Metabolic Panel:  Recent Labs Lab 03/27/14 1609 03/28/14 0335 03/29/14 0436 03/30/14 0535  NA 136 139 140 139  K 4.2 3.8 3.7 3.5  CL 102 105 108 106  CO2 26 26 25 22   GLUCOSE 138* 121* 120* 105*  BUN 15 15 15 14   CREATININE 1.23 1.22 1.27 1.26  CALCIUM 8.6 8.4 8.7 8.5  MG  --  1.8  --   --   PHOS  --  2.9  --   --    Liver Function Tests:  Recent Labs Lab 03/27/14 1609 03/28/14 0335 03/29/14 0436 03/30/14 0535  AST 81* 73* 78* 72*  ALT 39 33 36 34  ALKPHOS 134* 116 116 106  BILITOT 2.7* 2.5* 3.8* 3.7*  PROT 7.0 6.2 6.7 6.8  ALBUMIN 2.0* 1.9* 2.0* 1.9*   No results for input(s): LIPASE, AMYLASE in the last 168 hours.  Recent Labs Lab 03/27/14 1750 03/28/14 1242 03/29/14 0400 03/30/14 0535  AMMONIA 95* 69* 86* 68*   CBC:  Recent Labs Lab 03/27/14 1609 03/28/14 0335 03/29/14 0436 03/30/14 0535  WBC 3.6* 3.2* 3.8* 3.0*  HGB 10.7* 10.0* 10.5* 10.4*  HCT 31.5* 30.1* 31.4* 30.7*  MCV 96.9 97.7 98.4 99.0  PLT 64* 61* 58* 54*   Cardiac  Enzymes:   No results for input(s): CKTOTAL, CKMB, CKMBINDEX, TROPONINI in the last 168 hours. BNP (last 3 results)  Recent Labs  02/15/14 1614  BNP 202.5*    ProBNP (last 3 results) No results for input(s): PROBNP in the last 8760 hours.  CBG:  Recent Labs Lab 03/29/14 1129 03/29/14 1612 03/29/14 2115 03/30/14 0551 03/30/14 1119  GLUCAP 126* 111* 99 96 109*    No results found for this or any previous visit (from the past 240 hour(s)).   Studies: Dg Abd Portable 1v  03/29/2014   CLINICAL DATA:  Evaluate for constipation.  EXAM: PORTABLE ABDOMEN - 1 VIEW  COMPARISON:  None.  FINDINGS: Nonobstructive bowel gas pattern. Moderate stool in the colon. No supine evidence of free air. No organomegaly or suspicious calcification. No acute bony abnormality.  IMPRESSION: Moderate stool burden.  No acute findings.   Electronically Signed   By: Rolm Baptise M.D.   On: 03/29/2014 15:48    Scheduled Meds: . aMILoride  5 mg Oral Daily  . darifenacin  15 mg Oral Daily  . docusate sodium  100 mg Oral BID  . ferrous sulfate  325 mg Oral q morning - 10a  . furosemide  40 mg Oral Daily  . insulin aspart  0-2 Units Subcutaneous TID WC  . lactulose  40 g Oral QID  . lactulose  300 mL Rectal BID  . nystatin   Topical BID  . pantoprazole  40 mg Oral Daily  . propranolol  10 mg Oral BID  . rifaximin  550 mg Oral BID  . sodium chloride  3 mL Intravenous Q12H  . sodium chloride  3 mL Intravenous Q12H  . venlafaxine  75 mg Oral BID    Continuous Infusions:    Time spent: 25 minutes  [facility] Pager [phone]. If 7PM-7AM, please contact night-coverage at [email or web address], password [facility] 03/30/2014, 4:35 PM  LOS: 3 days

## 2014-03-31 DIAGNOSIS — N182 Chronic kidney disease, stage 2 (mild): Secondary | ICD-10-CM

## 2014-03-31 DIAGNOSIS — Z7189 Other specified counseling: Secondary | ICD-10-CM

## 2014-03-31 DIAGNOSIS — G9340 Encephalopathy, unspecified: Secondary | ICD-10-CM

## 2014-03-31 DIAGNOSIS — Z515 Encounter for palliative care: Secondary | ICD-10-CM

## 2014-03-31 LAB — GLUCOSE, CAPILLARY
GLUCOSE-CAPILLARY: 85 mg/dL (ref 70–99)
GLUCOSE-CAPILLARY: 94 mg/dL (ref 70–99)
Glucose-Capillary: 94 mg/dL (ref 70–99)
Glucose-Capillary: 123 mg/dL — ABNORMAL HIGH (ref 70–99)

## 2014-03-31 MED ORDER — LACTULOSE 10 GM/15ML PO SOLN
40.0000 g | Freq: Four times a day (QID) | ORAL | Status: DC
  Administered 2014-03-31 – 2014-04-02 (×6): 40 g via ORAL
  Filled 2014-03-31 (×15): qty 60

## 2014-03-31 MED ORDER — ALPRAZOLAM 0.25 MG PO TABS: 0.2500 mg | ORAL_TABLET | Freq: Three times a day (TID) | ORAL | Status: DC | PRN

## 2014-03-31 MED ORDER — LACTULOSE ENEMA: 300.0000 mL | Freq: Two times a day (BID) | ORAL | Status: DC

## 2014-03-31 NOTE — Progress Notes (Signed)
PT Cancellation Note  Patient Details Name: [name] MRN: [id number] DOB: 06/16/1935   Cancelled Treatment:    Reason Eval/Treat Not Completed: Other (comment) (Wife does not wish for PT to see patient) Following patient's case, noted palliative care has met with family regarding advanced directives - "At this time family is open to all offered and available medical interventions to prolong life. They today have begun to process the very difficult decisions around continuing life prolonging interventions vs interventions to enhance comfort, quality and dignity." Attempted to follow up with patient and family regarding their goals and expectations with physical therapy. Patient's wife expressed frustration and did not wish to speak with therapist; requested MD be paged, which was followed up with by RN. RN reports MD will speak with family concerning goals of care. Will hold therapy at this time.  Ellouise Newer 03/31/2014, 3:46 PM Camille Bal [place], [place]

## 2014-03-31 NOTE — Progress Notes (Signed)
Pt has not voided this AM.  Attempted to bladder scan patient x3.  Pt very irritable and agitated this AM.  He will not let me touch him in attempt bladder scan.  Claudette Stapler, RN

## 2014-03-31 NOTE — Consult Note (Signed)
Patient [name]      DOB: January 14, 1935      EXN:[id number]     Consult Note from the Palliative Medicine Team at [facility] Requested by: Dr Maryland Pink     PCP: Shirline Frees, MD Reason for Consultation: Clarification of GOPC and options     Phone Number:[phone]  Assessment of patients Current state:  Continued physical, functional and cognitive decline 2/2 to ESLD, CKD, CHF, DM, HTN.  Drastic decline/poor response to medical intervetnions over past 2 months, complicated care needs.  Family is faced with advanced directive decisions and anticipatory care needs  Consult is for review of medical treatment options, clarification of goals of care and end of life issues, disposition and options, and symptom recommendation.  This NP Wadie Lessen reviewed medical records, received report from team, assessed the patient and then meet at the patient's bedside along with his wife and daughter  to discuss diagnosis prognosis, GOC, EOL wishes disposition and options.  A detailed discussion was had today regarding advanced directives.  Concepts specific to code status, artifical feeding and hydration, continued IV antibiotics and rehospitalization was had.  The difference between a aggressive medical intervention path  and a palliative comfort care path for this patient at this time was had.  Values and goals of care important to patient and family were attempted to be elicited.  Concept of Hospice and Palliative Care were discussed  Natural trajectory and expectations at EOL were discussed.  Questions and concerns addressed.  Hard Choices booklet left for review. Family encouraged to call with questions or concerns.  PMT will continue to support holistically.   Goals of Care: 1.  Code Status: DNR/DNI-established today   2. Scope of Treatment: At this time family is open to all offered and available medical interventions to prolong life.  They today have begun to process the  very difficult decisions around continuing life prolonging interventions vs interventions to enhance comfort, quality and dignity.  3. Disposition:  Pending outcomes   4. Symptom Management:   Anxiety/Agitation:   -calm setting, minimize conversation, light and open blinds during day  5. Psychosocial: Emotional support offered to family at bedside.  It is very difficult to process the limitations of medicine and the mortality of a loved one.  I offered to meet again on Monday if that would be helpful   Patient Documents Completed or Given: Document Given Completed  Advanced Directives Pkt    MOST X   DNR    Gone from My Sight    Hard Choices X     Brief HPI:  79 yo male with h/o NASH cirrhosis as well as DM, HTN and known cognitive impairment brought to hospital with worsening confusion 2/2 to hepatic encephalopathy. Overall poor response to medical interventions (lactulose), continues with elevated ammonia level.  Refusing medications and interventions, now with sitter.  ROS: unable to illicit, patient is confused and agitated   PMH:  Past Medical History  Diagnosis Date  . NASH (nonalcoholic steatohepatitis)   . Diabetes mellitus without complication   . Kidney disease   . Hypertension   . GERD (gastroesophageal reflux disease)     esophageal varices  . Depression   . Anxiety   . Sleep apnea     use CPAP-14  . Anemia   . Esophageal varices      PSH: Past Surgical History  Procedure Laterality Date  . Tonsillectomy    . Left arm surgery  1984    crush injury-pins  . Esophagogastroduodenoscopy (egd) with propofol N/A 11/18/2012    Procedure: ESOPHAGOGASTRODUODENOSCOPY (EGD) WITH PROPOFOL;  Surgeon: Cleotis Nipper, MD;  Location: [facility];  Service: Endoscopy;  Laterality: N/A;  . Colonoscopy with propofol N/A 11/18/2012    Procedure: COLONOSCOPY WITH PROPOFOL;  Surgeon: Cleotis Nipper, MD;  Location: [facility];  Service: Endoscopy;  Laterality: N/A;    I have reviewed the [place] and SH and  If appropriate update it with new information. Allergies  Allergen Reactions  . Aldactone [Spironolactone] Swelling    Breast swell, painful   Scheduled Meds: . aMILoride  5 mg Oral Daily  . darifenacin  15 mg Oral Daily  . docusate sodium  100 mg Oral BID  . ferrous sulfate  325 mg Oral q morning - 10a  . furosemide  40 mg Oral Daily  . insulin aspart  0-2 Units Subcutaneous TID WC  . lactulose  40 g Oral QID  . lactulose  300 mL Rectal BID  . nystatin   Topical BID  . pantoprazole  40 mg Oral Daily  . propranolol  10 mg Oral BID  . rifaximin  550 mg Oral BID  . sodium chloride  3 mL Intravenous Q12H  . sodium chloride  3 mL Intravenous Q12H  . venlafaxine  75 mg Oral BID   Continuous Infusions:  PRN Meds:.sodium chloride, bisacodyl, ondansetron **OR** ondansetron (ZOFRAN) IV, polyethylene glycol, sodium chloride    BP 113/49 mmHg  Pulse 68  Temp(Src) 98.6 F (37 C) (Oral)  Resp 18  Ht 5\' 7"  (1.702 m)  SpO2 98%   PPS:30%    Intake/Output Summary (Last 24 hours) at 03/31/14 1223 Last data filed at 03/30/14 1900  Gross per 24 hour  Intake    240 ml  Output      0 ml  Net    240 ml    Physical Exam:  General: chronically ill appearing, agitated and confused HEENT:  Moist buccal membranes Chest:   Decreased in bases CVS: RRR Abdomen: distended, firm  Labs: CBC    Component Value Date/Time   WBC 3.0* 03/30/2014 0535   RBC 3.10* 03/30/2014 0535   HGB 10.4* 03/30/2014 0535   HCT 30.7* 03/30/2014 0535   PLT 54* 03/30/2014 0535   MCV 99.0 03/30/2014 0535   MCH 33.5 03/30/2014 0535   MCHC 33.9 03/30/2014 0535   RDW 17.8* 03/30/2014 0535   LYMPHSABS 1.4 09/21/2012 1705   MONOABS 0.7 09/21/2012 1705   EOSABS 0.1 09/21/2012 1705   BASOSABS 0.0 09/21/2012 1705    BMET    Component Value Date/Time   NA 139 03/30/2014 0535   K 3.5 03/30/2014 0535   CL 106 03/30/2014 0535   CO2 22 03/30/2014 0535   GLUCOSE 105*  03/30/2014 0535   BUN 14 03/30/2014 0535   CREATININE 1.26 03/30/2014 0535   CALCIUM 8.5 03/30/2014 0535   GFRNONAA 54* 03/30/2014 0535   GFRAA 62* 03/30/2014 0535    CMP     Component Value Date/Time   NA 139 03/30/2014 0535   K 3.5 03/30/2014 0535   CL 106 03/30/2014 0535   CO2 22 03/30/2014 0535   GLUCOSE 105* 03/30/2014 0535   BUN 14 03/30/2014 0535   CREATININE 1.26 03/30/2014 0535   CALCIUM 8.5 03/30/2014 0535   PROT 6.8 03/30/2014 0535   ALBUMIN 1.9* 03/30/2014 0535   AST 72* 03/30/2014 0535   ALT 34 03/30/2014 0535   ALKPHOS  106 03/30/2014 0535   BILITOT 3.7* 03/30/2014 0535   GFRNONAA 54* 03/30/2014 0535   GFRAA 62* 03/30/2014 0535     Time In Time Out Total Time Spent with Patient Total Overall Time  0900 1200 150 min 180 min    Greater than 50%  of this time was spent counseling and coordinating care related to the above assessment and plan.   Wadie Lessen NP  Palliative Medicine Team Team Phone # [phone] Pager [phone]  Discussed with Dr Lyman Speller and nursing staff

## 2014-03-31 NOTE — Progress Notes (Signed)
 PROGRESS NOTE  [name] MRN:[id number] DOB: 11/15/1935 DOA: 03/27/2014 PCP: HARRIS, WILLIAM, MD  HPI/Recap of past 24 hours: Patient is a 79-year-old male with past mental history of [place] cirrhosis as well as diabetes and hypertension who was brought in for worsening confusion and felt to be secondary to hepatic encephalopathy.  Patient initially responded to lactulose and had some improvement in mentation on 3/15, but was more acutely confused today, 3/16. Ammonia level checked and found to be increasing.  Over the last few days, patient's mentation has continued to wax and wane. He at times refuses to take his lactulose. Ammonia levels are improved then gone up again. He today refuses lab work.  Assessment/Plan: Principal Problem:   Hepatic encephalopathy secondary to liver cirrhosis from [place]: Suspect some issue may be patient's constipation, confirmed by abdominal x-ray. We're able to convince him to orally take more lactulose and Xifaximin.  Monitor thrombocytopenia and will try lactulose enemas.  Palliative care met with family and had an extensive discussion with them, followed by myself. Patient's overall life expectancy is quite limited. His hepatologist felt that he likely had about a year or so last July. After seeing his frequent hospitalizations now and little improvement spite attempts to use lactulose, I suspect that he likely will only have months. Family understands this and they have started making some considerations. We have started limiting some of his medicines keeping only his medicine for depression, lactulose, Xifaxan and medicine for agitation. It made him a DO NOT RESUSCITATE. Likely plan for comfort care soon  Active Problems:   Diabetes mellitus: CBG stable. Treating only with customized sliding scale if sugars get above 300   HTN (hypertension): Stable, continue home meds, patient has borderline low blood pressure   Liver cirrhosis secondary to NASH: Noted  elevation in bilirubin, persistent again today. GI consulted. Getting abdominal ultrasound.   Thrombocytopenia: No active bleeding, monitoring platelet counts. Secondary to liver cirrhosis.    CKD (chronic kidney disease) stage 2, GFR 60-89 ml/min   Chronic diastolic heart failure: Does not look to be acutely volume overloaded at this time   Constipation: See above   Code Status: Full code  Family Communication: Met with wife and daughter today  Disposition Plan: Suspect he likely will go to skilled nursing with hospice versus fall on hospice facility  Consultants:  Gastroenterology  Palliative care   Procedures:  None  Antibiotics:  None   Objective: BP 109/50 mmHg  Pulse 75  Temp(Src) 98.1 F (36.7 C) (Axillary)  Resp 18  Ht 5' 7" (1.702 m)  SpO2 97%  Intake/Output Summary (Last 24 hours) at 03/31/14 1624 Last data filed at 03/31/14 1430  Gross per 24 hour  Intake    340 ml  Output      0 ml  Net    340 ml   There were no vitals filed for this visit.  Exam:  exam mostly unchanged   General:  Easily agitated  Cardiovascular: Regular rate and rhythm, S1-S2  Respiratory: Clear to auscultation bilaterally  Abdomen: Soft, mild abdominal distention, nontender, hypoactive bowel sounds  Musculoskeletal: No clubbing or cyanosis, trace pitting edema, SCDs in place   Data Reviewed: Basic Metabolic Panel:  Recent Labs Lab 03/27/14 1609 03/28/14 0335 03/29/14 0436 03/30/14 0535  NA 136 139 140 139  K 4.2 3.8 3.7 3.5  CL 102 105 108 106  CO2 26 26 25 22  GLUCOSE 138* 121* 120* 105*  BUN 15 15 15 14    CREATININE 1.23 1.22 1.27 1.26  CALCIUM 8.6 8.4 8.7 8.5  MG  --  1.8  --   --   PHOS  --  2.9  --   --    Liver Function Tests:  Recent Labs Lab 03/27/14 1609 03/28/14 0335 03/29/14 0436 03/30/14 0535  AST 81* 73* 78* 72*  ALT 39 33 36 34  ALKPHOS 134* 116 116 106  BILITOT 2.7* 2.5* 3.8* 3.7*  PROT 7.0 6.2 6.7 6.8  ALBUMIN 2.0* 1.9* 2.0* 1.9*    No results for input(s): LIPASE, AMYLASE in the last 168 hours.  Recent Labs Lab 03/27/14 1750 03/28/14 1242 03/29/14 0400 03/30/14 0535  AMMONIA 95* 69* 86* 68*   CBC:  Recent Labs Lab 03/27/14 1609 03/28/14 0335 03/29/14 0436 03/30/14 0535  WBC 3.6* 3.2* 3.8* 3.0*  HGB 10.7* 10.0* 10.5* 10.4*  HCT 31.5* 30.1* 31.4* 30.7*  MCV 96.9 97.7 98.4 99.0  PLT 64* 61* 58* 54*   Cardiac Enzymes:   No results for input(s): CKTOTAL, CKMB, CKMBINDEX, TROPONINI in the last 168 hours. BNP (last 3 results)  Recent Labs  02/15/14 1614  BNP 202.5*    ProBNP (last 3 results) No results for input(s): PROBNP in the last 8760 hours.  CBG:  Recent Labs Lab 03/30/14 1119 03/30/14 1622 03/30/14 2102 03/31/14 0628 03/31/14 1106  GLUCAP 109* 108* 118* 85 94    No results found for this or any previous visit (from the past 240 hour(s)).   Studies: Us Abdomen Complete  03/30/2014   CLINICAL DATA:  Cirrhosis.  Elevated liver function tests.  EXAM: ULTRASOUND ABDOMEN COMPLETE  COMPARISON:  02/16/2014.  FINDINGS: Gallbladder: No gallstones or wall thickening visualized. No sonographic Murphy sign noted.  Common bile duct: Diameter: 3.2 mm  Liver: Small, with irregular contours and increased echogenicity.  IVC: Not well visualized  Pancreas: Not visualized  Spleen: Diffusely enlarged  Right Kidney: Length: 11.8 cm.  1.4 cm exophytic cyst  Left Kidney: Length: 10.4 cm. Echogenicity within normal limits. No mass or hydronephrosis visualized.  Abdominal aorta: Normal in caliber proximally. The mid and distal portions were obscured by overlying gas.  Other findings: Small to moderate amount of free peritoneal fluid adjacent to the liver.  IMPRESSION: 1. Stable changes of cirrhosis of the liver with associated splenomegaly. 2. Small to moderate amount of ascites without significant change. 3. Obscuration of the pancreas and majority of the inferior vena cava and aorta by overlying bowel gas.    Electronically Signed   By: Steven  Reid M.D.   On: 03/30/2014 18:52    Scheduled Meds: . insulin aspart  0-2 Units Subcutaneous TID WC  . lactulose  40 g Oral QID  . lactulose  300 mL Rectal BID  . propranolol  10 mg Oral BID  . rifaximin  550 mg Oral BID  . sodium chloride  3 mL Intravenous Q12H  . sodium chloride  3 mL Intravenous Q12H  . venlafaxine  75 mg Oral BID    Continuous Infusions:    Time spent: 45 minutes, majority of which was spent as extended time with discussion of family for goals of care  , K  [facility] Pager [phone]. If 7PM-7AM, please contact night-coverage at [email or web address], password [email or web address] 03/31/2014, 4:24 PM  LOS: 4 days              

## 2014-03-31 NOTE — Progress Notes (Signed)
Utilization review completed.  

## 2014-03-31 NOTE — Progress Notes (Signed)
Pt does not have date label on IV and refuses to let me touch or look at site. Pt resting with call bell within reach.  Will continue to monitor. Safety sitter in the room. Payton Emerald, RN

## 2014-03-31 NOTE — Progress Notes (Addendum)
Patient ID: [name], male   DOB: [birth year], 79 y.o.   MRN: [id number] [facility] Progress Note  [name] 79 y.o. [birth year]   Subjective: More confused today. Slow to respond to answering person, place, and time questions but answers them correctly. Sitter in the room.  Objective: Vital signs in last 24 hours: Filed Vitals:   03/31/14 0629  BP: 113/49  Pulse: 68  Temp: 98.6 F (37 C)  Resp: 18    Physical Exam: Gen: awake, oriented X 3, no acute distress CV: RRR Chest: Clear anteriorly Abd: soft, nontender, nondistended, +BS  Lab Results:  Recent Labs  03/29/14 0436 03/30/14 0535  NA 140 139  K 3.7 3.5  CL 108 106  CO2 25 22  GLUCOSE 120* 105*  BUN 15 14  CREATININE 1.27 1.26  CALCIUM 8.7 8.5    Recent Labs  03/29/14 0436 03/30/14 0535  AST 78* 72*  ALT 36 34  ALKPHOS 116 106  BILITOT 3.8* 3.7*  PROT 6.7 6.8  ALBUMIN 2.0* 1.9*    Recent Labs  03/29/14 0436 03/30/14 0535  WBC 3.8* 3.0*  HGB 10.5* 10.4*  HCT 31.4* 30.7*  MCV 98.4 99.0  PLT 58* 54*   No results for input(s): LABPROT, INR in the last 72 hours.    Assessment/Plan: Hepatic Encephalopathy - more confused today. No BMs recorded yesterday. BM this morning. Agree with adding Lactulose enemas. He does not seem willing to allow lab draw. Dr. Cristina Gong to see tomorrow.   [place] C. 03/31/2014, 10:30 AM

## 2014-03-31 NOTE — Clinical Social Work Placement (Signed)
Clinical Social Work Department CLINICAL SOCIAL WORK PLACEMENT NOTE 03/31/2014  Patient:  [name], [name]  Account Number:  [id number] Admit date:  03/27/2014  Clinical Social Worker:  Domenica Reamer, CLINICAL SOCIAL WORKER  Date/time:  03/29/2014 04:02 PM  Clinical Social Work is seeking post-discharge placement for this patient at the following level of care:   SKILLED NURSING   (*CSW will update this form in Epic as items are completed)   03/31/2014  Patient/family provided with [place] Department of Clinical Social Work's list of facilities offering this level of care within the geographic area requested by the patient (or if unable, by the patient's family).  03/31/2014  Patient/family informed of their freedom to choose among providers that offer the needed level of care, that participate in Medicare, Medicaid or managed care program needed by the patient, have an available bed and are willing to accept the patient.  03/31/2014  Patient/family informed of [facility]' ownership interest in [facility], as well as of the fact that they are under no obligation to receive care at this facility.  PASARR submitted to EDS on 03/31/2014 PASARR number received on 03/31/2014  FL2 transmitted to all facilities in geographic area requested by pt/family on  03/31/2014 FL2 transmitted to all facilities within larger geographic area on   Patient informed that his/her managed care company has contracts with or will negotiate with  certain facilities, including the following:     Patient/family informed of bed offers received:   Patient chooses bed at  Physician recommends and patient chooses bed at    Patient to be transferred to  on   Patient to be transferred to facility by  Patient and family notified of transfer on  Name of family member notified:    The following physician request were entered in Epic:   Additional Comments: Domenica Reamer, [place] Social  Worker [phone]

## 2014-04-01 LAB — CBC
HCT: 32.9 % — ABNORMAL LOW (ref 39.0–52.0)
HEMOGLOBIN: 11.2 g/dL — AB (ref 13.0–17.0)
MCH: 33.7 pg (ref 26.0–34.0)
MCHC: 34 g/dL (ref 30.0–36.0)
MCV: 99.1 fL (ref 78.0–100.0)
Platelets: 61 10*3/uL — ABNORMAL LOW (ref 150–400)
RBC: 3.32 MIL/uL — AB (ref 4.22–5.81)
RDW: 17.7 % — AB (ref 11.5–15.5)
WBC: 2.7 10*3/uL — ABNORMAL LOW (ref 4.0–10.5)

## 2014-04-01 LAB — COMPREHENSIVE METABOLIC PANEL
ALT: 34 U/L (ref 0–53)
AST: 75 U/L — AB (ref 0–37)
Albumin: 2 g/dL — ABNORMAL LOW (ref 3.5–5.2)
Alkaline Phosphatase: 116 U/L (ref 39–117)
Anion gap: 9 (ref 5–15)
BUN: 13 mg/dL (ref 6–23)
CO2: 23 mmol/L (ref 19–32)
Calcium: 8.7 mg/dL (ref 8.4–10.5)
Chloride: 107 mmol/L (ref 96–112)
Creatinine, Ser: 1.16 mg/dL (ref 0.50–1.35)
GFR, EST AFRICAN AMERICAN: 69 mL/min — AB (ref >90)
GFR, EST NON AFRICAN AMERICAN: 59 mL/min — AB (ref >90)
GLUCOSE: 129 mg/dL — AB (ref 70–99)
POTASSIUM: 3.6 mmol/L (ref 3.5–5.1)
SODIUM: 139 mmol/L (ref 135–145)
Total Bilirubin: 4.2 mg/dL — ABNORMAL HIGH (ref 0.3–1.2)
Total Protein: 7.2 g/dL (ref 6.0–8.3)

## 2014-04-01 LAB — GLUCOSE, CAPILLARY
GLUCOSE-CAPILLARY: 106 mg/dL — AB (ref 70–99)
GLUCOSE-CAPILLARY: 104 mg/dL — AB (ref 70–99)
Glucose-Capillary: 123 mg/dL — ABNORMAL HIGH (ref 70–99)
Glucose-Capillary: 107 mg/dL — ABNORMAL HIGH (ref 70–99)

## 2014-04-01 LAB — AMMONIA: Ammonia: 72 umol/L — ABNORMAL HIGH (ref 11–32)

## 2014-04-01 MED ORDER — DOCUSATE SODIUM 100 MG PO CAPS
100.0000 mg | ORAL_CAPSULE | Freq: Two times a day (BID) | ORAL | Status: DC | PRN
  Administered 2014-04-02: 100 mg via ORAL
  Filled 2014-04-01: qty 1

## 2014-04-01 NOTE — Progress Notes (Signed)
PROGRESS NOTE  [name] IWL:[id number] DOB: [birth year] DOA: 03/27/2014 PCP: Shirline Frees, MD  HPI/Recap of past 24 hours: Patient is a 79 year old male with past mental history of [name] cirrhosis as well as diabetes and hypertension who was brought in for worsening confusion and felt to be secondary to hepatic encephalopathy.  Patient initially responded to lactulose and had some improvement in mentation on 3/15, but was more acutely confused today, 3/16. Ammonia level checked and found to be increasing.  Over the last few days, patient's mentation has continued to wax and wane. He at times refuses to take his lactulose. Ammonia levels are improved then gone up again. Patient's bilirubin continues to steadily increase in output of 4.2. Still remains easily agitated at times. Unable to eat interact with me appropriately  Assessment/Plan: Principal Problem:   Hepatic encephalopathy secondary to liver cirrhosis from [place]: Suspect some issue may be patient's constipation, confirmed by abdominal x-ray. We're able to convince him to orally take more lactulose and Xifaximin at times.  Monitor thrombocytopenia. Abdominal ultrasound unrevealing After discussions with the family, given worsening bilirubin, they are leaning likely to work full comfort care and are looking at hospice facilities  Palliative care met with family and had an extensive discussion with them, followed by myself. Patient's overall life expectancy is quite limited. His hepatologist felt that he likely had about a year or so last July. After seeing his frequent hospitalizations now and little improvement spite attempts to use lactulose, I suspect that he likely will only have months. Family understands this and they have started making some considerations. We have started limiting some of his medicines keeping only his medicine for depression, lactulose, Xifaxan and medicine for agitation. It made him a DO NOT RESUSCITATE.    Active Problems:   Diabetes mellitus: CBG stable. Treating only with customized sliding scale if sugars get above 300   HTN (hypertension): Stable, continue home meds, patient has borderline low blood pressure  Thrombocytopenia: No active bleeding, monitoring platelet counts. Secondary to liver cirrhosis.    CKD (chronic kidney disease) stage 2, GFR 60-89 ml/min   Chronic diastolic heart failure: Does not look to be acutely volume overloaded at this time   Constipation: See above   Code Status: Full code  Family Communication: Spoke with wife at the bedside  Disposition Plan: Given worsening bilirubin, suspect that he is closer to the end. Looking at hospice facilities  Consultants:  Gastroenterology  Palliative care   Procedures:  None  Antibiotics:  None   Objective: BP 124/57 mmHg  Pulse 64  Temp(Src) 97.7 F (36.5 C) (Oral)  Resp 18  Ht $R'5\' 7"'sM$  (1.702 m)  SpO2 100%  Intake/Output Summary (Last 24 hours) at 04/01/14 1558 Last data filed at 04/01/14 1003  Gross per 24 hour  Intake      0 ml  Output      0 ml  Net      0 ml   There were no vitals filed for this visit.  Exam:   General:  Easily agitated  Sclera: Mild icteric  Cardiovascular: Regular rate and rhythm, S1-S2  Respiratory: Clear to auscultation bilaterally  Abdomen: Soft, mild abdominal distention, nontender, hypoactive bowel sounds  Musculoskeletal: No clubbing or cyanosis, trace pitting edema, SCDs in place   Data Reviewed: Basic Metabolic Panel:  Recent Labs Lab 03/27/14 1609 03/28/14 0335 03/29/14 0436 03/30/14 0535 04/01/14 0330  NA 136 139 140 139 139  K 4.2 3.8 3.7 3.5 3.6  CL 102 105 108 106 107  CO2 $Re'26 26 25 22 23  'Hry$ GLUCOSE 138* 121* 120* 105* 129*  BUN $Re'15 15 15 14 13  'Chf$ CREATININE 1.23 1.22 1.27 1.26 1.16  CALCIUM 8.6 8.4 8.7 8.5 8.7  MG  --  1.8  --   --   --   PHOS  --  2.9  --   --   --    Liver Function Tests:  Recent Labs Lab 03/27/14 1609  03/28/14 0335 03/29/14 0436 03/30/14 0535 04/01/14 0330  AST 81* 73* 78* 72* 75*  ALT 39 33 36 34 34  ALKPHOS 134* 116 116 106 116  BILITOT 2.7* 2.5* 3.8* 3.7* 4.2*  PROT 7.0 6.2 6.7 6.8 7.2  ALBUMIN 2.0* 1.9* 2.0* 1.9* 2.0*   No results for input(s): LIPASE, AMYLASE in the last 168 hours.  Recent Labs Lab 03/27/14 1750 03/28/14 1242 03/29/14 0400 03/30/14 0535 04/01/14 0330  AMMONIA 95* 69* 86* 68* 72*   CBC:  Recent Labs Lab 03/27/14 1609 03/28/14 0335 03/29/14 0436 03/30/14 0535 04/01/14 0330  WBC 3.6* 3.2* 3.8* 3.0* 2.7*  HGB 10.7* 10.0* 10.5* 10.4* 11.2*  HCT 31.5* 30.1* 31.4* 30.7* 32.9*  MCV 96.9 97.7 98.4 99.0 99.1  PLT 64* 61* 58* 54* 61*   Cardiac Enzymes:   No results for input(s): CKTOTAL, CKMB, CKMBINDEX, TROPONINI in the last 168 hours. BNP (last 3 results)  Recent Labs  02/15/14 1614  BNP 202.5*    ProBNP (last 3 results) No results for input(s): PROBNP in the last 8760 hours.  CBG:  Recent Labs Lab 03/31/14 1106 03/31/14 1647 03/31/14 2110 04/01/14 0558 04/01/14 1135  GLUCAP 94 94 123* 123* 106*    No results found for this or any previous visit (from the past 240 hour(s)).   Studies: No results found.  Scheduled Meds: . insulin aspart  0-2 Units Subcutaneous TID WC  . lactulose  40 g Oral QID  . lactulose  300 mL Rectal BID  . propranolol  10 mg Oral BID  . rifaximin  550 mg Oral BID  . sodium chloride  3 mL Intravenous Q12H  . sodium chloride  3 mL Intravenous Q12H  . venlafaxine  75 mg Oral BID    Continuous Infusions:    Time spent: 15 minutes  [facility] Pager [phone]. If 7PM-7AM, please contact night-coverage at [email or web address], password [facility] 04/01/2014, 3:58 PM  LOS: 5 days

## 2014-04-01 NOTE — Progress Notes (Signed)
Pt is inappropriate, but not really confused. He immediately called me "Mortimer Fries" as soon as I came in the room.  He is oriented to place and time.  There is no asterixis. He would not cooperate for doing serial 7's as a mental status exam, instead he wandered off the topic.  He has some paranoid ideation--"this is a prison, and I tried to escape 5 times last week."    IMPR:  To me, this is more compatible with an adverse drug reaction (delerium) than hepatic encephalopathy, although he's certainly at risk for the latter.  RECOMM:  Would consider Psych consult to help assess altered mental status and guide psychoactive medications.  ???Wean off Effexor   ??? Start Haldol   ???stop Benzo's  [name], M.D. [id number]

## 2014-04-02 LAB — COMPREHENSIVE METABOLIC PANEL
ALK PHOS: 107 U/L (ref 39–117)
ALT: 32 U/L (ref 0–53)
AST: 69 U/L — ABNORMAL HIGH (ref 0–37)
Albumin: 1.8 g/dL — ABNORMAL LOW (ref 3.5–5.2)
Anion gap: 7 (ref 5–15)
BUN: 12 mg/dL (ref 6–23)
CHLORIDE: 105 mmol/L (ref 96–112)
CO2: 27 mmol/L (ref 19–32)
CREATININE: 1.17 mg/dL (ref 0.50–1.35)
Calcium: 8.4 mg/dL (ref 8.4–10.5)
GFR calc non Af Amer: 59 mL/min — ABNORMAL LOW (ref >90)
GFR, EST AFRICAN AMERICAN: 68 mL/min — AB (ref >90)
Glucose, Bld: 112 mg/dL — ABNORMAL HIGH (ref 70–99)
Potassium: 3.6 mmol/L (ref 3.5–5.1)
SODIUM: 139 mmol/L (ref 135–145)
Total Bilirubin: 3.3 mg/dL — ABNORMAL HIGH (ref 0.3–1.2)
Total Protein: 6.8 g/dL (ref 6.0–8.3)

## 2014-04-02 LAB — GLUCOSE, CAPILLARY
Glucose-Capillary: 109 mg/dL — ABNORMAL HIGH (ref 70–99)
Glucose-Capillary: 101 mg/dL — ABNORMAL HIGH (ref 70–99)
Glucose-Capillary: 144 mg/dL — ABNORMAL HIGH (ref 70–99)

## 2014-04-02 MED ORDER — HALOPERIDOL LACTATE 5 MG/ML IJ SOLN
2.5000 mg | Freq: Once | INTRAMUSCULAR | Status: DC
  Filled 2014-04-02: qty 0.5

## 2014-04-02 MED ORDER — LORAZEPAM 2 MG/ML IJ SOLN
2.0000 mg | Freq: Once | INTRAMUSCULAR | Status: AC
  Administered 2014-04-02: 2 mg via INTRAMUSCULAR
  Filled 2014-04-02: qty 1

## 2014-04-02 NOTE — Progress Notes (Signed)
RT Note: Pt on home CPAP resting comfortably at this time

## 2014-04-02 NOTE — Progress Notes (Signed)
PROGRESS NOTE  ANGELL HONSE WSF:[id number] DOB: 1935-05-15 DOA: 03/27/2014 PCP: Shirline Frees, MD  HPI/Recap of past 24 hours: Patient is a 79 year old male with past mental history of [name] cirrhosis as well as diabetes and hypertension who was brought in for worsening confusion and felt to be secondary to hepatic encephalopathy.  Patient initially responded to lactulose and had some improvement in mentation on 3/15, but was more acutely confused today, 3/16. Ammonia level checked and found to be increasing.  Over the last few days, patient's mentation has continued to wax and wane. He at times refuses to take his lactulose. Ammonia levels are improved then gone up again. Patient's bilirubin for the past week have been steadily rising and peaked as high as 4.2 yesterday, but then came back down today to 3.3. Patient self is more quiet and reserved and interactive today, but he is still overall confused.  Assessment/Plan: Principal Problem:   Hepatic encephalopathy secondary to liver cirrhosis from [place]: Suspect some issue may be patient's constipation, confirmed by abdominal x-ray. We're able to convince him to orally take more lactulose and Xifaximin at times.  Monitor thrombocytopenia. Abdominal ultrasound unrevealing other than underlying cirrhosis. Gastroneurology feels that some of patient's confusion may be more related to other factors which is possibly medication versus hospital psychosis clouding issue with hepatic encephalopathy.  Palliative care met with family and had an extensive discussion with them, followed by myself. Patient's overall life expectancy is quite limited. His hepatologist felt that he likely had about a year or so last July. After seeing his frequent hospitalizations now and little improvement spite attempts to use lactulose, I suspect that he likely will only have months. Family understands this and they have started making some considerations. We have started  limiting some of his medicines keeping only his medicine for depression, lactulose, Xifaxan and medicine for agitation. It made him a DO NOT RESUSCITATE.   Active Problems:   Diabetes mellitus: CBG stable. Treating only with customized sliding scale if sugars get above 300   HTN (hypertension): Stable, continue home meds, patient has borderline low blood pressure  Thrombocytopenia: No active bleeding, monitoring platelet counts. Secondary to liver cirrhosis.    CKD (chronic kidney disease) stage 2, GFR 60-89 ml/min   Chronic diastolic heart failure: Does not look to be acutely volume overloaded at this time   Constipation: See above   Code Status: Full code  Family Communication: Spoke with wife and son at the bedside  Disposition Plan: Had extensive discussion with patient's wife, and reinforced that patient's underlying prognosis remains the same long-term. Plan will be for him to go from here to a skilled nursing facility likely in the next 48 hours and continue limited approach to minimize discomfort and focus on quality of life  Consultants:  Gastroenterology  Palliative care   Procedures:  None  Antibiotics:  None   Objective: BP 126/54 mmHg  Pulse 67  Temp(Src) 98.4 F (36.9 C) (Oral)  Resp 18  Ht $R'5\' 7"'nR$  (1.702 m)  SpO2 98%  Intake/Output Summary (Last 24 hours) at 04/02/14 1719 Last data filed at 04/02/14 0900  Gross per 24 hour  Intake    120 ml  Output      0 ml  Net    120 ml   There were no vitals filed for this visit.  Exam:   General:  Confused, less agitated  Cardiovascular: Regular rate and rhythm, S1-S2  Respiratory: Clear to auscultation bilaterally  Abdomen: Soft,  mild abdominal distention, nontender, hypoactive bowel sounds  Musculoskeletal: No clubbing or cyanosis, trace pitting edema,  Data Reviewed: Basic Metabolic Panel:  Recent Labs Lab 03/28/14 0335 03/29/14 0436 03/30/14 0535 04/01/14 0330 04/02/14 0517  NA 139 140  139 139 139  K 3.8 3.7 3.5 3.6 3.6  CL 105 108 106 107 105  CO2 $Re'26 25 22 23 27  'wmZ$ GLUCOSE 121* 120* 105* 129* 112*  BUN $Re'15 15 14 13 12  'eCx$ CREATININE 1.22 1.27 1.26 1.16 1.17  CALCIUM 8.4 8.7 8.5 8.7 8.4  MG 1.8  --   --   --   --   PHOS 2.9  --   --   --   --    Liver Function Tests:  Recent Labs Lab 03/28/14 0335 03/29/14 0436 03/30/14 0535 04/01/14 0330 04/02/14 0517  AST 73* 78* 72* 75* 69*  ALT 33 36 34 34 32  ALKPHOS 116 116 106 116 107  BILITOT 2.5* 3.8* 3.7* 4.2* 3.3*  PROT 6.2 6.7 6.8 7.2 6.8  ALBUMIN 1.9* 2.0* 1.9* 2.0* 1.8*   No results for input(s): LIPASE, AMYLASE in the last 168 hours.  Recent Labs Lab 03/27/14 1750 03/28/14 1242 03/29/14 0400 03/30/14 0535 04/01/14 0330  AMMONIA 95* 69* 86* 68* 72*   CBC:  Recent Labs Lab 03/27/14 1609 03/28/14 0335 03/29/14 0436 03/30/14 0535 04/01/14 0330  WBC 3.6* 3.2* 3.8* 3.0* 2.7*  HGB 10.7* 10.0* 10.5* 10.4* 11.2*  HCT 31.5* 30.1* 31.4* 30.7* 32.9*  MCV 96.9 97.7 98.4 99.0 99.1  PLT 64* 61* 58* 54* 61*   Cardiac Enzymes:   No results for input(s): CKTOTAL, CKMB, CKMBINDEX, TROPONINI in the last 168 hours. BNP (last 3 results)  Recent Labs  02/15/14 1614  BNP 202.5*    ProBNP (last 3 results) No results for input(s): PROBNP in the last 8760 hours.  CBG:  Recent Labs Lab 04/01/14 1652 04/01/14 2150 04/02/14 0648 04/02/14 1132 04/02/14 1634  GLUCAP 107* 104* 109* 101* 144*    No results found for this or any previous visit (from the past 240 hour(s)).   Studies: No results found.  Scheduled Meds: . insulin aspart  0-2 Units Subcutaneous TID WC  . lactulose  40 g Oral QID  . propranolol  10 mg Oral BID  . rifaximin  550 mg Oral BID  . sodium chloride  3 mL Intravenous Q12H  . sodium chloride  3 mL Intravenous Q12H  . venlafaxine  75 mg Oral BID    Continuous Infusions:    Time spent: 15 minutes  [place] [facility] Pager [phone]. If 7PM-7AM, please  contact night-coverage at [email or web address], password [facility] 04/02/2014, 5:19 PM  LOS: 6 days

## 2014-04-02 NOTE — Progress Notes (Signed)
Patient's status is similar to yesterday. He is awake and alert, and his speech is clear, but his ideas are largely incoherent and inappropriate. There is no asterixis.  Abdomen soft and nontender, without overt distention (ultrasound 3 days ago showed just a small amount of intra-abdominal fluid).  The majority of today's 20 minute encounter was spent discussing with the patient's wife, his son, and the hospitalist physician, issues of long-term prognosis and management. Based on his condition over the past couple of days, it would appear that death from hepatic failure is not imminent, and that therefore long-term skilled nursing will be needed. I think the biggest issue in the short-term is going to be cognitive and behavioral management, and it appears that this will go beyond simply managing his hepatic encephalopathy, which I believe is only one component of his current cognitive and behavioral issues. The hospitalist has seen my recommendations from yesterday.  We will continue to follow the patient with you, but at the moment, have no additional suggestions.  Cleotis Nipper, M.D. [phone]

## 2014-04-03 DIAGNOSIS — G9340 Encephalopathy, unspecified: Secondary | ICD-10-CM

## 2014-04-03 DIAGNOSIS — K7290 Hepatic failure, unspecified without coma: Secondary | ICD-10-CM | POA: Insufficient documentation

## 2014-04-03 LAB — GLUCOSE, CAPILLARY
GLUCOSE-CAPILLARY: 118 mg/dL — AB (ref 70–99)
Glucose-Capillary: 100 mg/dL — ABNORMAL HIGH (ref 70–99)
Glucose-Capillary: 84 mg/dL (ref 70–99)

## 2014-04-03 LAB — COMPREHENSIVE METABOLIC PANEL
ALBUMIN: 1.8 g/dL — AB (ref 3.5–5.2)
ALK PHOS: 99 U/L (ref 39–117)
ALT: 32 U/L (ref 0–53)
AST: 89 U/L — ABNORMAL HIGH (ref 0–37)
Anion gap: 5 (ref 5–15)
BUN: 13 mg/dL (ref 6–23)
CALCIUM: 8.2 mg/dL — AB (ref 8.4–10.5)
CO2: 27 mmol/L (ref 19–32)
CREATININE: 1.07 mg/dL (ref 0.50–1.35)
Chloride: 106 mmol/L (ref 96–112)
GFR calc Af Amer: 76 mL/min — ABNORMAL LOW (ref >90)
GFR calc non Af Amer: 65 mL/min — ABNORMAL LOW (ref >90)
Glucose, Bld: 102 mg/dL — ABNORMAL HIGH (ref 70–99)
POTASSIUM: 4.5 mmol/L (ref 3.5–5.1)
Sodium: 138 mmol/L (ref 135–145)
TOTAL PROTEIN: 6 g/dL (ref 6.0–8.3)
Total Bilirubin: 3.3 mg/dL — ABNORMAL HIGH (ref 0.3–1.2)

## 2014-04-03 MED ORDER — VENLAFAXINE HCL 37.5 MG PO TABS
37.5000 mg | ORAL_TABLET | Freq: Two times a day (BID) | ORAL | Status: DC
  Administered 2014-04-03: 37.5 mg via ORAL
  Filled 2014-04-03 (×3): qty 1

## 2014-04-03 MED ORDER — LORAZEPAM 1 MG PO TABS
1.0000 mg | ORAL_TABLET | ORAL | Status: DC | PRN
  Administered 2014-04-03 – 2014-04-04 (×2): 1 mg via ORAL
  Filled 2014-04-03 (×2): qty 1

## 2014-04-03 MED ORDER — CETYLPYRIDINIUM CHLORIDE 0.05 % MT LIQD
7.0000 mL | Freq: Two times a day (BID) | OROMUCOSAL | Status: DC
  Administered 2014-04-03 – 2014-04-04 (×2): 7 mL via OROMUCOSAL

## 2014-04-03 MED ORDER — MORPHINE SULFATE (CONCENTRATE) 10 MG/0.5ML PO SOLN: 5.0000 mg | ORAL | Status: DC | PRN

## 2014-04-03 NOTE — Progress Notes (Signed)
Pt has become more irritable and confused tonight, pt stating inappropriate comments, pt is noted in fetal position with his eyes closed and shouting. MD on call was notified, orders given to give 2 mg Ativan IM, orders followed through. Pt has seem to calm down a lot, VSS, call bell within reach. RN will continue to monitor.

## 2014-04-03 NOTE — Progress Notes (Signed)
Progress Note from the Palliative Medicine Team at [facility] and plan:  -patient is resting comfortably, did not stimulate for exam  -continued conversation with wife regarding GOC and treatment plan       Plan is for full comfort  -no further diagnostics, no artifical feeding or hydration, symptom management to enhance comfort  - reassess in morning for hospice facility eligibility  Continued conversation regarding natural trajectory and expectations at end of life.  Questions and concerns addressed   Objective: Allergies  Allergen Reactions  . Aldactone [Spironolactone] Swelling    Breast swell, painful   Scheduled Meds: . venlafaxine  37.5 mg Oral BID   Continuous Infusions:  PRN Meds:.docusate sodium, LORazepam, morphine CONCENTRATE, ondansetron **OR** ondansetron (ZOFRAN) IV  BP 126/54 mmHg  Pulse 67  Temp(Src) 98.4 F (36.9 C) (Oral)  Resp 18  Ht 5\' 7"  (1.702 m)  SpO2 98%     No intake or output data in the 24 hours ending 04/03/14 1009     Labs: CBC    Component Value Date/Time   WBC 2.7* 04/01/2014 0330   RBC 3.32* 04/01/2014 0330   HGB 11.2* 04/01/2014 0330   HCT 32.9* 04/01/2014 0330   PLT 61* 04/01/2014 0330   MCV 99.1 04/01/2014 0330   MCH 33.7 04/01/2014 0330   MCHC 34.0 04/01/2014 0330   RDW 17.7* 04/01/2014 0330   LYMPHSABS 1.4 09/21/2012 1705   MONOABS 0.7 09/21/2012 1705   EOSABS 0.1 09/21/2012 1705   BASOSABS 0.0 09/21/2012 1705    BMET    Component Value Date/Time   NA 139 04/02/2014 0517   K 3.6 04/02/2014 0517   CL 105 04/02/2014 0517   CO2 27 04/02/2014 0517   GLUCOSE 112* 04/02/2014 0517   BUN 12 04/02/2014 0517   CREATININE 1.17 04/02/2014 0517   CALCIUM 8.4 04/02/2014 0517   GFRNONAA 59* 04/02/2014 0517   GFRAA 68* 04/02/2014 0517    CMP     Component Value Date/Time   NA 139 04/02/2014 0517   K 3.6 04/02/2014 0517   CL 105 04/02/2014 0517   CO2 27 04/02/2014 0517   GLUCOSE 112* 04/02/2014  0517   BUN 12 04/02/2014 0517   CREATININE 1.17 04/02/2014 0517   CALCIUM 8.4 04/02/2014 0517   PROT 6.8 04/02/2014 0517   ALBUMIN 1.8* 04/02/2014 0517   AST 69* 04/02/2014 0517   ALT 32 04/02/2014 0517   ALKPHOS 107 04/02/2014 0517   BILITOT 3.3* 04/02/2014 0517   GFRNONAA 59* 04/02/2014 0517   GFRAA 68* 04/02/2014 0517     Patient Documents Completed or Given: Document Given Completed  Advanced Directives Pkt    MOST X   DNR  X  Gone from My Sight    Hard Choices X     Time In Time Out Total Time Spent with Patient Total Overall Time  0930 1010 35 min 40 min    Greater than 50%  of this time was spent counseling and coordinating care related to the above assessment and plan.  Wadie Lessen NP  Palliative Medicine Team Team Phone # [phone] Pager [phone]  Discussed with Dr Maryland Pink 1

## 2014-04-03 NOTE — Progress Notes (Signed)
PROGRESS NOTE  [name] QKM:[id number] DOB: Jun 01, 1935 DOA: 03/27/2014 PCP: Shirline Frees, MD  HPI/Recap of past 24 hours: Patient is a 79 year old male with past mental history of [name] cirrhosis as well as diabetes and hypertension who was brought in for worsening confusion and felt to be secondary to hepatic encephalopathy.  Patient initially responded to lactulose and had some improvement in mentation on 3/15, but was more acutely confused today, 3/16. Ammonia level checked and found to be increasing.  Over the last few days, patient's mentation has continued to wax and wane. He at times refuses to take his lactulose. Ammonia levels are improved then gone up again. Patient's bilirubin for the past week have been steadily rising and peaked as high as 4.2,but have trended back down some. His mentation has done the same in that times he is calm and pleasantly confused versus other times quite severely agitated.   Last night, patient was very violent requiring sedation. He is currently somnolent  Assessment/Plan: Principal Problem:   Hepatic encephalopathy secondary to liver cirrhosis from [place]: Suspect some issue may be patient's constipation, confirmed by abdominal x-ray. We're able to convince him to orally take more lactulose and Xifaximin at times.  Monitor thrombocytopenia. Abdominal ultrasound unrevealing other than underlying cirrhosis. Gastroneurology feels that some of patient's confusion may be more related to other factors which is possibly medication versus hospital psychosis clouding issue with hepatic encephalopathy.  Palliative care met with family and had an extensive discussion with them, followed by myself. Patient's overall life expectancy is quite limited. His hepatologist felt that he likely had about a year or so last July. After seeing his frequent hospitalizations now and little improvement despite  attempts to use lactulose.  Family understands this and they have  started making some considerations. We have started limiting some of his medicines keeping only his medicine for depression, lactulose, Xifaxan and medicine for agitation. It made him a DO NOT RESUSCITATE.   After last nights episodes, wife and family are in agreement that patient should be made comfort care. Seen by palliative care today and medication simplified only for sedation, nausea and pain  Active Problems:   Diabetes mellitus: CBG stable. Treating only with customized sliding scale if sugars get above 300   HTN (hypertension):Stable now   Thrombocytopenia: No active bleeding, monitoring platelet counts. Secondary to liver cirrhosis.    CKD (chronic kidney disease) stage 2, GFR 60-89 ml/min   Chronic diastolic heart failure: Does not look to be acutely volume overloaded at this time   Constipation: See above   Code Status: Full code  Family Communication: Spoke with wife at the bedside   Disposition Plan: Looking for hospice eligibility  Consultants:  Gastroenterology  Palliative care   Procedures:  None  Antibiotics:  None   Objective: BP 106/51 mmHg  Pulse 63  Temp(Src) 98 F (36.7 C) (Oral)  Resp 18  Ht $R'5\' 7"'yT$  (1.702 m)  Wt 88.8 kg (195 lb 12.3 oz)  BMI 30.65 kg/m2  SpO2 95%  Intake/Output Summary (Last 24 hours) at 04/03/14 1802 Last data filed at 04/03/14 1500  Gross per 24 hour  Intake     75 ml  Output      0 ml  Net     75 ml   Filed Weights   04/03/14 1500  Weight: 88.8 kg (195 lb 12.3 oz)    Exam:   General:  Somnolent  Cardiovascular: Regular rate and rhythm, S1-S2  Respiratory: Clear  to auscultation bilaterally  Abdomen: Soft, mild abdominal distention, nontender, hypoactive bowel sounds  Musculoskeletal: No clubbing or cyanosis, trace pitting edema,  Data Reviewed: Basic Metabolic Panel:  Recent Labs Lab 03/28/14 0335 03/29/14 0436 03/30/14 0535 04/01/14 0330 04/02/14 0517 04/03/14 0853  NA 139 140 139 139 139 138   K 3.8 3.7 3.5 3.6 3.6 4.5  CL 105 108 106 107 105 106  CO2 $Re'26 25 22 23 27 27  'FHk$ GLUCOSE 121* 120* 105* 129* 112* 102*  BUN $Re'15 15 14 13 12 13  'VRw$ CREATININE 1.22 1.27 1.26 1.16 1.17 1.07  CALCIUM 8.4 8.7 8.5 8.7 8.4 8.2*  MG 1.8  --   --   --   --   --   PHOS 2.9  --   --   --   --   --    Liver Function Tests:  Recent Labs Lab 03/29/14 0436 03/30/14 0535 04/01/14 0330 04/02/14 0517 04/03/14 0853  AST 78* 72* 75* 69* 89*  ALT 36 34 34 32 32  ALKPHOS 116 106 116 107 99  BILITOT 3.8* 3.7* 4.2* 3.3* 3.3*  PROT 6.7 6.8 7.2 6.8 6.0  ALBUMIN 2.0* 1.9* 2.0* 1.8* 1.8*   No results for input(s): LIPASE, AMYLASE in the last 168 hours.  Recent Labs Lab 03/28/14 1242 03/29/14 0400 03/30/14 0535 04/01/14 0330  AMMONIA 69* 86* 68* 72*   CBC:  Recent Labs Lab 03/28/14 0335 03/29/14 0436 03/30/14 0535 04/01/14 0330  WBC 3.2* 3.8* 3.0* 2.7*  HGB 10.0* 10.5* 10.4* 11.2*  HCT 30.1* 31.4* 30.7* 32.9*  MCV 97.7 98.4 99.0 99.1  PLT 61* 58* 54* 61*   Cardiac Enzymes:   No results for input(s): CKTOTAL, CKMB, CKMBINDEX, TROPONINI in the last 168 hours. BNP (last 3 results)  Recent Labs  02/15/14 1614  BNP 202.5*    ProBNP (last 3 results) No results for input(s): PROBNP in the last 8760 hours.  CBG:  Recent Labs Lab 04/02/14 0648 04/02/14 1132 04/02/14 1634 04/03/14 1119 04/03/14 1742  GLUCAP 109* 101* 144* 100* 118*    No results found for this or any previous visit (from the past 240 hour(s)).   Studies: No results found.  Scheduled Meds: . antiseptic oral rinse  7 mL Mouth Rinse BID  . venlafaxine  37.5 mg Oral BID    Continuous Infusions:    Time spent: 15 minutes  [facility] Pager [phone]. If 7PM-7AM, please contact night-coverage at [email or web address], password [facility] 04/03/2014, 6:02 PM  LOS: 7 days

## 2014-04-03 NOTE — Progress Notes (Signed)
Pt received Ativan (2mg  IM) around midnight for agitation and sleeplessness, and has been sleeping ever since, per discussion w/ sitter.  At this time, pt is sound asleep and breathing heavily, not awakened by voices in room.  Ativan in low doses may be a good choice for this pt, since it does not rely as much on hepatic metabolism for elimination.  Other options might be Haldol but I am not sure how well that is tolerated in liver patients and I would recommend input from a psychiatrist before starting pt on that.  Cleotis Nipper, M.D. [phone]

## 2014-04-03 NOTE — Progress Notes (Signed)
RT placed pt on his home CPAP machine via home nasal mask. CPAP is already set on 14cmH2O per home settings on room air- SpO2 93%. RT will continue to monitor as needed.

## 2014-04-04 DIAGNOSIS — R451 Restlessness and agitation: Secondary | ICD-10-CM | POA: Insufficient documentation

## 2014-04-04 LAB — GLUCOSE, CAPILLARY: GLUCOSE-CAPILLARY: 126 mg/dL — AB (ref 70–99)

## 2014-04-04 MED ORDER — LORAZEPAM 1 MG PO TABS: 1.0000 mg | ORAL_TABLET | ORAL | Status: AC | PRN

## 2014-04-04 MED ORDER — LORAZEPAM 1 MG PO TABS: 1.0000 mg | ORAL_TABLET | Freq: Three times a day (TID) | ORAL | Status: AC

## 2014-04-04 MED ORDER — MORPHINE SULFATE (CONCENTRATE) 10 MG /0.5 ML PO SOLN: 5.0000 mg | ORAL | Status: AC | PRN

## 2014-04-04 MED ORDER — LORAZEPAM 1 MG PO TABS
1.0000 mg | ORAL_TABLET | Freq: Three times a day (TID) | ORAL | Status: DC
  Filled 2014-04-04: qty 1

## 2014-04-04 MED ORDER — ONDANSETRON HCL 4 MG PO TABS: 4.0000 mg | ORAL_TABLET | Freq: Three times a day (TID) | ORAL | Status: AC | PRN

## 2014-04-04 NOTE — Discharge Summary (Signed)
Discharge Summary  [name] VHQ:[id number] DOB: 02-16-1935  PCP: Ronnie Frees, MD  Admit date: 03/27/2014 Anticipated Discharge date: 04/05/2014  Time spent: 25 minutes  Recommendations for Outpatient Follow-up:  1. Patient is being transitioned to comfort care. All of his previous medications have been discontinued. 2. Medications include when necessary and scheduled Ativan, when necessary morphine solution and Zofran 3. Patient is being evaluated and will go to hospice facility versus skilled nursing with hospice services  Discharge Diagnoses:  Active Hospital Problems   Diagnosis Date Noted  . Hepatic encephalopathy 09/21/2012  . Agitation   . Liver failure   . Palliative care encounter 03/31/2014  . DNR (do not resuscitate) discussion 03/31/2014  . Acute encephalopathy 03/31/2014  . CKD (chronic kidney disease) stage 2, GFR 60-89 ml/min 03/29/2014  . Chronic diastolic heart failure 41/32/4401  . Constipation 03/29/2014  . Altered mental status 03/27/2014  . Liver cirrhosis secondary to NASH   . Thrombocytopenia   . Diabetes mellitus 09/21/2012  . HTN (hypertension) 09/21/2012    Resolved Hospital Problems   Diagnosis Date Noted Date Resolved  No resolved problems to display.    Discharge Condition: Prognosis poor  Diet recommendation: Comfort feeds only  Filed Weights   04/03/14 1500  Weight: 88.8 kg (195 lb 12.3 oz)    History of present illness/Hospital course:  Patient is a 79 year old male with past mental history of [name] cirrhosis as well as diabetes and hypertension who was brought in for worsening confusion and felt to be secondary to hepatic encephalopathy.   Patient initially responded to lactulose and had some improvement in mentation on 3/15, but was more acutely confused today, 3/16. Ammonia level checked and found to be increasin Over the last few days, patient's mentation has continued to wax and wane. He at times refuses to take his  lactulose. Ammonia levels are improved then gone up again. Patient's bilirubin for the past week have been steadily rising and peaked as high as 4.2,but have trended back down some. His mentation has done the same in that times he is calm and pleasantly confused versus other times quite severely agitated. He takes now minimal by mouth.  Palliative care met with family and had an extensive discussion with them, followed by myself. Patient's overall life expectancy is quite limited. His hepatologist felt that he likely had about a year or so last July. After seeing his frequent hospitalizations now and little improvement despite attempts to use lactulose. Family understands this and they have started making some considerations to transition him to comfort care. Initially started with DO NOT RESUSCITATE and decreasing some of his medications. After some episodes of severe agitation, family decided that patient had poor quality of life and thought he should be full comfort care. Medication regimen simplified to focus only on agitation, nausea and pain. At this point is being evaluated for hospice inpatient facility versus skilled nursing with hospice services  Hospital Course:  Principal Problem:   Hepatic encephalopathy:Suspect some issue may be patient's constipation, confirmed by abdominal x-ray. We're able to convince him to orally take more lactulose and Xifaximin at times. Monitor thrombocytopenia. Abdominal ultrasound unrevealing other than underlying cirrhosis. Gastroenterology feels that some of patient's confusion may be more related to other factors which is possibly medication versus hospital psychosis clouding issue with hepatic encephalopathy.  Given overall decline, transitioning to hospice Active Problems:   Diabetes mellitus: With poor by mouth intake, family concerned but hypoglycemia forgetting insulin. Insulin discontinued   HTN (  hypertension)   Liver cirrhosis secondary to NASH    Thrombocytopenia: No active bleeding, secondary to liver cirrhosis   Altered mental status   CKD (chronic kidney disease) stage 2, GFR 60-89 ml/min: Creatinine has remained at baseline   Chronic diastolic heart failure: : Stable, no volume overload    Procedures:  None  Consultations:  Gastroenterology  Palliative care  Discharge Exam: BP 115/61 mmHg  Pulse 58  Temp(Src) 97.8 F (36.6 C) (Oral)  Resp 18  Ht _0  (1.702 m)  Wt 88.8 kg (195 lb 12.3 oz)  BMI 30.65 kg/m2  SpO2 99%  General: Somnolent Cardiovascular: Regular rate and rhythm, S1-S2 Respiratory: Clear to auscultation bilaterally  Discharge Instructions You were cared for by a hospitalist during your hospital stay. If you have any questions about your discharge medications or the care you received while you were in the hospital after you are discharged, you can call the unit and asked to speak with the hospitalist on call if the hospitalist that took care of you is not available. Once you are discharged, your primary care physician will handle any further medical issues. Please note that NO REFILLS for any discharge medications will be authorized once you are discharged, as it is imperative that you return to your primary care physician (or establish a relationship with a primary care physician if you do not have one) for your aftercare needs so that they can reassess your need for medications and monitor your lab values.     Medication List    STOP taking these medications        aMILoride 5 MG tablet  Commonly known as:  MIDAMOR     cholecalciferol 1000 UNITS tablet  Commonly known as:  VITAMIN D     clotrimazole-betamethasone cream  Commonly known as:  LOTRISONE     ferrous sulfate 325 (65 FE) MG tablet     furosemide 40 MG tablet  Commonly known as:  LASIX     insulin aspart protamine- aspart (70-30) 100 UNIT/ML injection  Commonly known as:  NOVOLOG MIX 70/30     lactulose 10 GM/15ML solution    Commonly known as:  CHRONULAC     multivitamin with minerals Tabs tablet     omeprazole 20 MG capsule  Commonly known as:  PRILOSEC     PRESCRIPTION MEDICATION     PROBIOTIC DAILY Caps     propranolol 10 MG tablet  Commonly known as:  INDERAL     rifaximin 550 MG Tabs tablet  Commonly known as:  XIFAXAN     solifenacin 10 MG tablet  Commonly known as:  VESICARE     venlafaxine 75 MG tablet  Commonly known as:  EFFEXOR      TAKE these medications        LORazepam 1 MG tablet  Commonly known as:  ATIVAN  Take 1 tablet (1 mg total) by mouth every 4 (four) hours as needed for anxiety.     LORazepam 1 MG tablet  Commonly known as:  ATIVAN  Take 1 tablet (1 mg total) by mouth 3 (three) times daily.     morphine CONCENTRATE 10 mg / 0.5 ml concentrated solution  Take 0.25 mLs (5 mg total) by mouth every hour as needed for moderate pain, severe pain or shortness of breath.     ondansetron 4 MG tablet  Commonly known as:  ZOFRAN  Take 1 tablet (4 mg total) by mouth every 8 (eight) hours as needed  for nausea or vomiting.       Allergies  Allergen Reactions  . Aldactone [Spironolactone] Swelling    Breast swell, painful      The results of significant diagnostics from this hospitalization (including imaging, microbiology, ancillary and laboratory) are listed below for reference.    Significant Diagnostic Studies: Dg Chest 2 View  03/27/2014  .  IMPRESSION: The stomach study limited technically due to positioning and hypoinflation. No acute cardiopulmonary abnormality is demonstrated.   Electronically Signed   By: David  Martinique   On: 03/27/2014 17:21   Ct Head Wo Contrast  03/27/2014   IMPRESSION: No acute abnormality.   Electronically Signed   By: Inge Rise M.D.   On: 03/27/2014 19:30   US Abdomen Complete  03/30/2014    IMPRESSION: 1. Stable changes of cirrhosis of the liver with associated splenomegaly. 2. Small to moderate amount of ascites without  significant change. 3. Obscuration of the pancreas and majority of the inferior vena cava and aorta by overlying bowel gas.   Electronically Signed   By: Claudie Revering M.D.   On: 03/30/2014 18:52   Dg Abd Portable 1v  03/29/2014   CLINICAL DATA:  Evaluate for constipation.  EXAM: PORTABLE ABDOMEN - 1 VIEW  COMPARISON:  None.  FINDINGS: Nonobstructive bowel gas pattern. Moderate stool in the colon. No supine evidence of free air. No organomegaly or suspicious calcification. No acute bony abnormality.  IMPRESSION: Moderate stool burden.  No acute findings.   Electronically Signed   By: Rolm Baptise M.D.   On: 03/29/2014 15:48    Microbiology: No results found for this or any previous visit (from the past 240 hour(s)).   Labs: Basic Metabolic Panel:  Recent Labs Lab 03/29/14 0436 03/30/14 0535 04/01/14 0330 04/02/14 0517 04/03/14 0853  NA 140 139 139 139 138  K 3.7 3.5 3.6 3.6 4.5  CL 108 106 107 105 106  CO2 _0 GLUCOSE 120* 105* 129* 112* 102*  BUN _1 CREATININE 1.27 1.26 1.16 1.17 1.07  CALCIUM 8.7 8.5 8.7 8.4 8.2*   Liver Function Tests:  Recent Labs Lab 03/29/14 0436 03/30/14 0535 04/01/14 0330 04/02/14 0517 04/03/14 0853  AST 78* 72* 75* 69* 89*  ALT 36 34 34 32 32  ALKPHOS 116 106 116 107 99  BILITOT 3.8* 3.7* 4.2* 3.3* 3.3*  PROT 6.7 6.8 7.2 6.8 6.0  ALBUMIN 2.0* 1.9* 2.0* 1.8* 1.8*   No results for input(s): LIPASE, AMYLASE in the last 168 hours.  Recent Labs Lab 03/29/14 0400 03/30/14 0535 04/01/14 0330  AMMONIA 86* 68* 72*   CBC:  Recent Labs Lab 03/29/14 0436 03/30/14 0535 04/01/14 0330  WBC 3.8* 3.0* 2.7*  HGB 10.5* 10.4* 11.2*  HCT 31.4* 30.7* 32.9*  MCV 98.4 99.0 99.1  PLT 58* 54* 61*   Cardiac Enzymes: No results for input(s): CKTOTAL, CKMB, CKMBINDEX, TROPONINI in the last 168 hours. BNP: BNP (last 3 results)  Recent Labs  02/15/14 1614  BNP 202.5*    ProBNP (last 3 results) No results for input(s):  PROBNP in the last 8760 hours.  CBG:  Recent Labs Lab 04/02/14 1634 04/03/14 1119 04/03/14 1742 04/03/14 2041 04/04/14 1908  GLUCAP 144* 100* 118* 84 126*       Signed:  Jaquesha Boroff K  [facility] 04/04/2014, 7:57 PM

## 2014-04-04 NOTE — Progress Notes (Signed)
Medicare Important Message given? YES  (If response is "NO", the following Medicare IM given date fields will be blank)  Date Medicare IM given: 04/04/14 Medicare IM given by:  Dahlia Client [employer]

## 2014-04-04 NOTE — Progress Notes (Signed)
Nutrition Brief Note  Chart reviewed. Pt now transitioning to comfort care.  No further nutrition interventions warranted at this time.  Please re-consult as needed.   Raynelle Fujikawa A. Cortni Tays, RD, LDN, CDE Pager: [phone] After hours Pager: [phone]  

## 2014-04-04 NOTE — Progress Notes (Signed)
Progress Note from the Palliative Medicine Team at [facility] and plan:  -patient is resting comfortably, more awake today but falls asleep  in mid sentence, tolerating only sips and bites, albumin 1.8, last ammonia level continued to climb at  72, no further lactulose or lab draws   -discussed with nursing need to utilize prn medications to promote control of agitation, family understands the use of mediation for symptom management   -patient continues with daily slow decline        Plan is for full comfort  -no further diagnostics, no artifical feeding or hydration, symptom management to enhance comfort  -family remain hopeful for hospice facility, will write for choice  Continued conversation regarding natural trajectory and expectations at end of life.  Questions and concerns addressed   Objective: Allergies  Allergen Reactions  . Aldactone [Spironolactone] Swelling    Breast swell, painful   Scheduled Meds: . antiseptic oral rinse  7 mL Mouth Rinse BID  . venlafaxine  37.5 mg Oral BID   Continuous Infusions:  PRN Meds:.docusate sodium, LORazepam, morphine CONCENTRATE, ondansetron **OR** ondansetron (ZOFRAN) IV  BP 118/52 mmHg  Pulse 60  Temp(Src) 97.6 F (36.4 C) (Oral)  Resp 18  Ht 5\' 7"  (1.702 m)  Wt 88.8 kg (195 lb 12.3 oz)  BMI 30.65 kg/m2  SpO2 98%      Intake/Output Summary (Last 24 hours) at 04/04/14 0856 Last data filed at 04/03/14 1800  Gross per 24 hour  Intake     75 ml  Output      0 ml  Net     75 ml       Labs: CBC    Component Value Date/Time   WBC 2.7* 04/01/2014 0330   RBC 3.32* 04/01/2014 0330   HGB 11.2* 04/01/2014 0330   HCT 32.9* 04/01/2014 0330   PLT 61* 04/01/2014 0330   MCV 99.1 04/01/2014 0330   MCH 33.7 04/01/2014 0330   MCHC 34.0 04/01/2014 0330   RDW 17.7* 04/01/2014 0330   LYMPHSABS 1.4 09/21/2012 1705   MONOABS 0.7 09/21/2012 1705   EOSABS 0.1 09/21/2012 1705   BASOSABS 0.0 09/21/2012 1705     BMET    Component Value Date/Time   NA 138 04/03/2014 0853   K 4.5 04/03/2014 0853   CL 106 04/03/2014 0853   CO2 27 04/03/2014 0853   GLUCOSE 102* 04/03/2014 0853   BUN 13 04/03/2014 0853   CREATININE 1.07 04/03/2014 0853   CALCIUM 8.2* 04/03/2014 0853   GFRNONAA 65* 04/03/2014 0853   GFRAA 76* 04/03/2014 0853    CMP     Component Value Date/Time   NA 138 04/03/2014 0853   K 4.5 04/03/2014 0853   CL 106 04/03/2014 0853   CO2 27 04/03/2014 0853   GLUCOSE 102* 04/03/2014 0853   BUN 13 04/03/2014 0853   CREATININE 1.07 04/03/2014 0853   CALCIUM 8.2* 04/03/2014 0853   PROT 6.0 04/03/2014 0853   ALBUMIN 1.8* 04/03/2014 0853   AST 89* 04/03/2014 0853   ALT 32 04/03/2014 0853   ALKPHOS 99 04/03/2014 0853   BILITOT 3.3* 04/03/2014 0853   GFRNONAA 65* 04/03/2014 0853   GFRAA 76* 04/03/2014 0853     Patient Documents Completed or Given: Document Given Completed  Advanced Directives Pkt    MOST X   DNR  X  Gone from My Sight    Hard Choices X     Time In Time Out Total Time  Spent with Patient Total Overall Time  0800 0835 35 min 40 min    Greater than 50%  of this time was spent counseling and coordinating care related to the above assessment and plan.  Wadie Lessen NP  Palliative Medicine Team Team Phone # [phone] Pager [phone]  Discussed with Dr Maryland Pink 1

## 2014-04-04 NOTE — Progress Notes (Signed)
Plan for transfer to Hospice facility noted.  Pt is in no distress at present; was alert and jovial but again talking inappropriately as he was 2 days ago.  Abd is nondistended and nontender, w/out overt ascites or hepatosplenomegaly.  He became somewhat agitated when I left the room.  Please let me know if I can assist further in this patient's care, but otherwise, will plan to sign off at this time.  Cleotis Nipper, M.D. [phone]

## 2014-04-04 NOTE — Clinical Social Work Note (Addendum)
4:30pm CSW spoke with Beverlee Nims at [facility] - per palliative we are getting labs on the pt in the morning to check his ammonia levels- depending on which way the ammonia is trending the pt can be accepted to hospice or can go to SNF with hospice or palliative following.  10:30am Per Palliative care pt family is agreeable to hospice and would like [facility] in [place] [phone])- CSW made referral - they will meet with the family at 11am to assess patient.  CSW will continue to follow.  Domenica Reamer, [place] Social Worker [phone]

## 2014-04-05 LAB — AMMONIA: Ammonia: 81 umol/L — ABNORMAL HIGH (ref 11–32)

## 2014-04-05 NOTE — Clinical Social Work Note (Signed)
Patient will discharge to [facility]  Anticipated discharge date:04/05/14 Family notified: at bedside Transportation by [facility]- scheduled for 1:30pm  CSW signing off.  Domenica Reamer, [place] Social Worker [phone]

## 2014-04-05 NOTE — Clinical Social Work Note (Signed)
CSW spoke with Beverlee Nims at [facility]- they can accept pt today into hospice care.  Beverlee Nims and pt wife will meet at 11:30 to complete paperwork- pt will be able to DC after paperwork is signed.  CSW will continue to follow.  Domenica Reamer, [place] Social Worker [phone]

## 2014-04-05 NOTE — Care Management Note (Signed)
    Page 1 of 1   04/05/2014     3:01:36 PM CARE MANAGEMENT NOTE 04/05/2014  Patient:  [name], [name]   Account Number:  [id number]  Date Initiated:  03/28/2014  Documentation initiated by:  Marvetta Gibbons  Subjective/Objective Assessment:   Pt admitted with hepatic enceph.     Action/Plan:   PTA pt was at [facility] following for placement needs   Anticipated DC Date:  04/05/2014   Anticipated DC Plan:  [place]  In-house referral  Clinical Social Worker         Choice offered to / List presented to:             Status of service:  Completed, signed off Medicare Important Message given?  YES (If response is "NO", the following Medicare IM given date fields will be blank) Date Medicare IM given:  03/30/2014 Medicare IM given by:  Marvetta Gibbons Date Additional Medicare IM given:  04/04/2014 Additional Medicare IM given by:  Marvetta Gibbons  Discharge Disposition:  [place]  Per UR Regulation:  Reviewed for med. necessity/level of care/duration of stay  If discussed at [place] of Stay Meetings, dates discussed:   04/04/2014    Comments:

## 2014-04-05 NOTE — Progress Notes (Signed)
Pt discharged, report given to the [facility].

## 2014-04-05 NOTE — Discharge Summary (Signed)
Please see full discharge summary dictated by Dr. Gevena Barre on 04/04/2014.   Patient will be discharged to hospice facility today 04/05/2014. No changes were made to patient's discharge summary, or medications, or instructions.    On day of discharge:Patient was seen and examined on day of discharge and was found to be stable for discharge. Vitals: Blood pressure 125/61, pulse 58, temperature 97.6 F (36.4 C), temperature source Oral, resp. rate 18, height 5\' 7"  (1.702 m), weight 84.9 kg (187 lb 2.7 oz), SpO2 98 %. Gen. no apparent distress, well-developed well-nourished Cardiovascular normal S1/S2, regular rate and rhythm Respiratory clear to auscultation bilaterally  Ammonia level was noted to be 81.  Time spent: 20 minutes  Mylasia Vorhees D.O. [facility] Pager [phone]  If 7PM-7AM, please contact night-coverage [email or web address] Password [facility] 04/05/2014, 10:16 AM

## 2014-07-10 ENCOUNTER — Other Ambulatory Visit: Payer: Self-pay

## 2014-07-14 DEATH — deceased

## 2015-11-27 IMAGING — DX DG CHEST 2V
2 series · 2 of 2 positions shown · non-contrast
Comparison: 02/15/2014

CLINICAL DATA: Hypoxia, confusion

EXAM:
CHEST  2 VIEW

[chest pa]
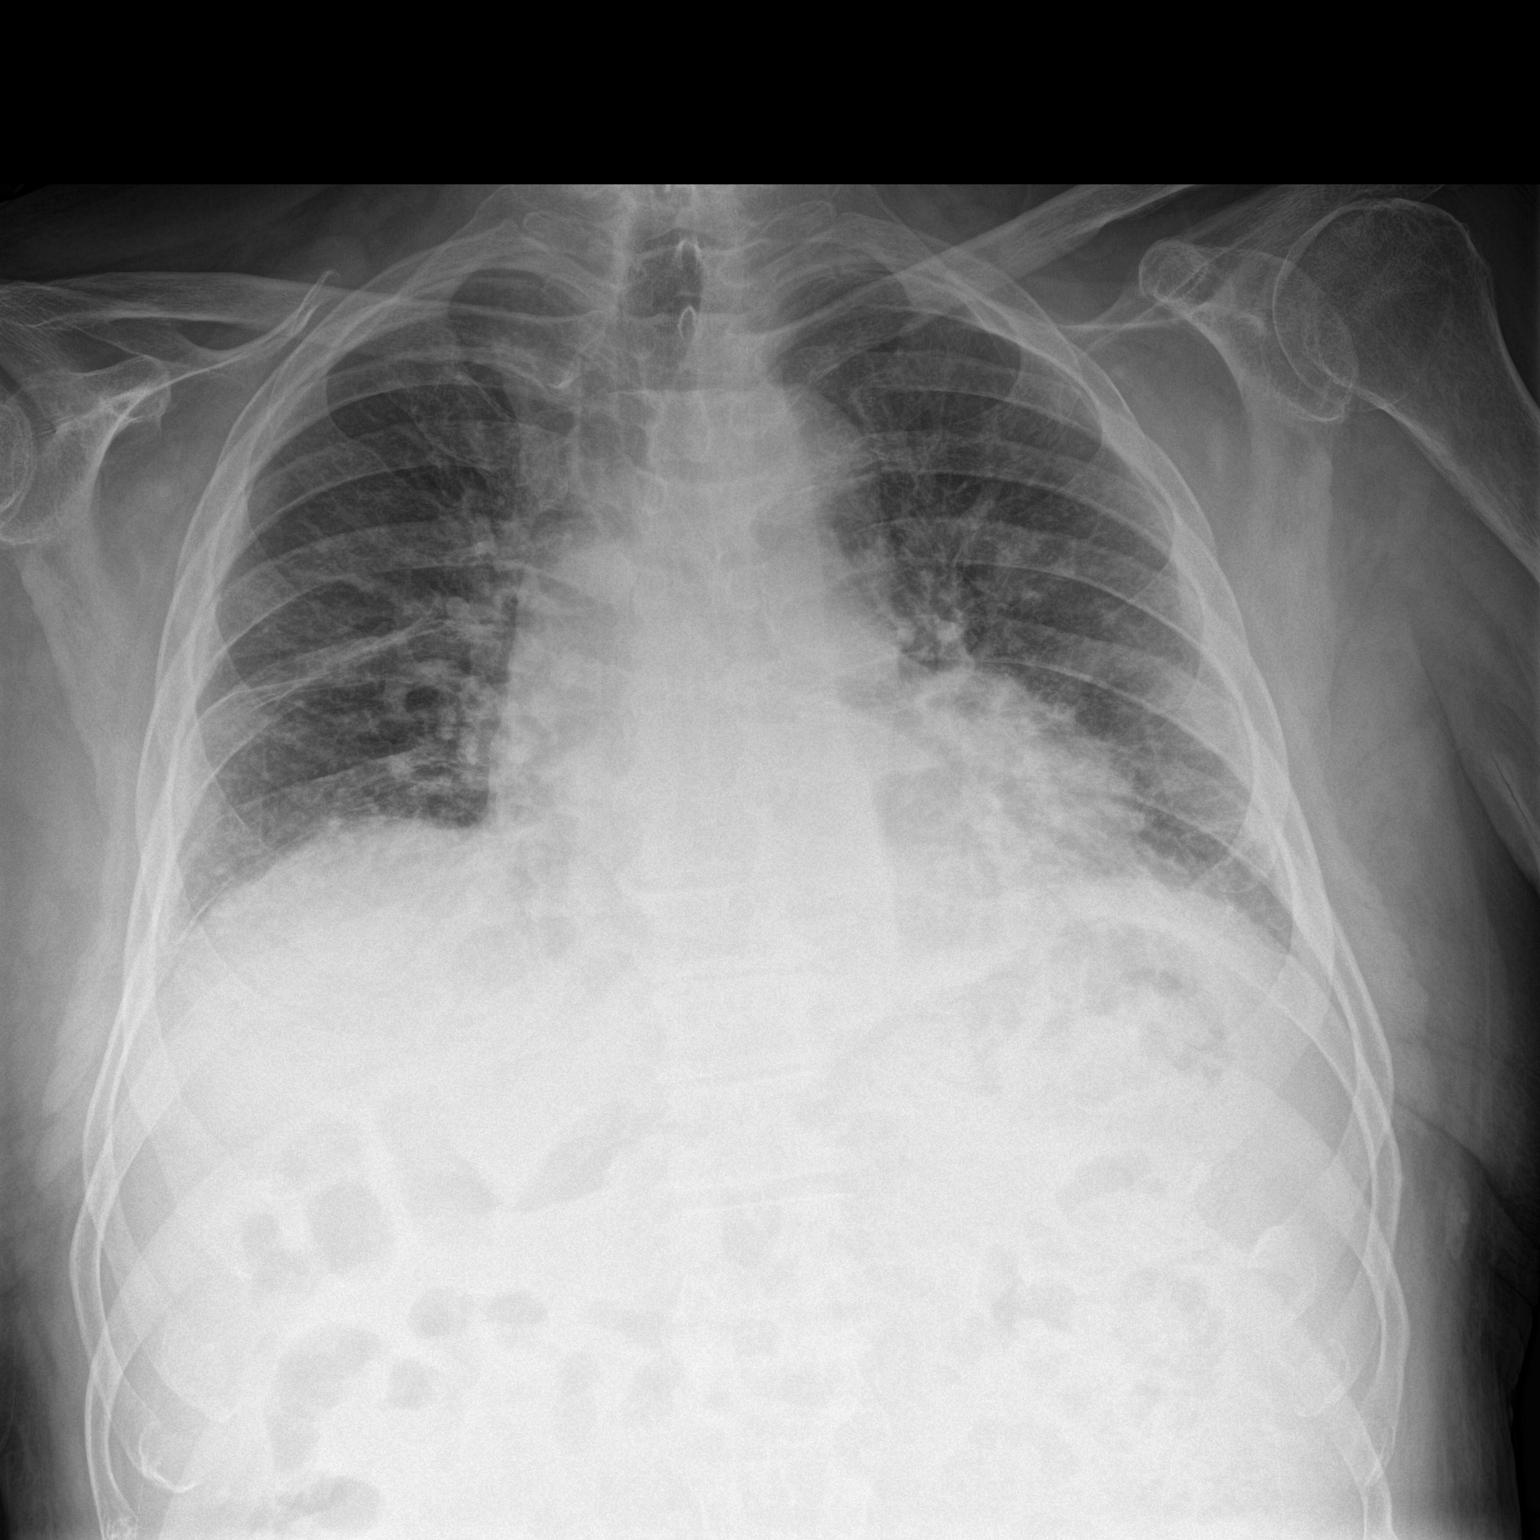

[chest lat]
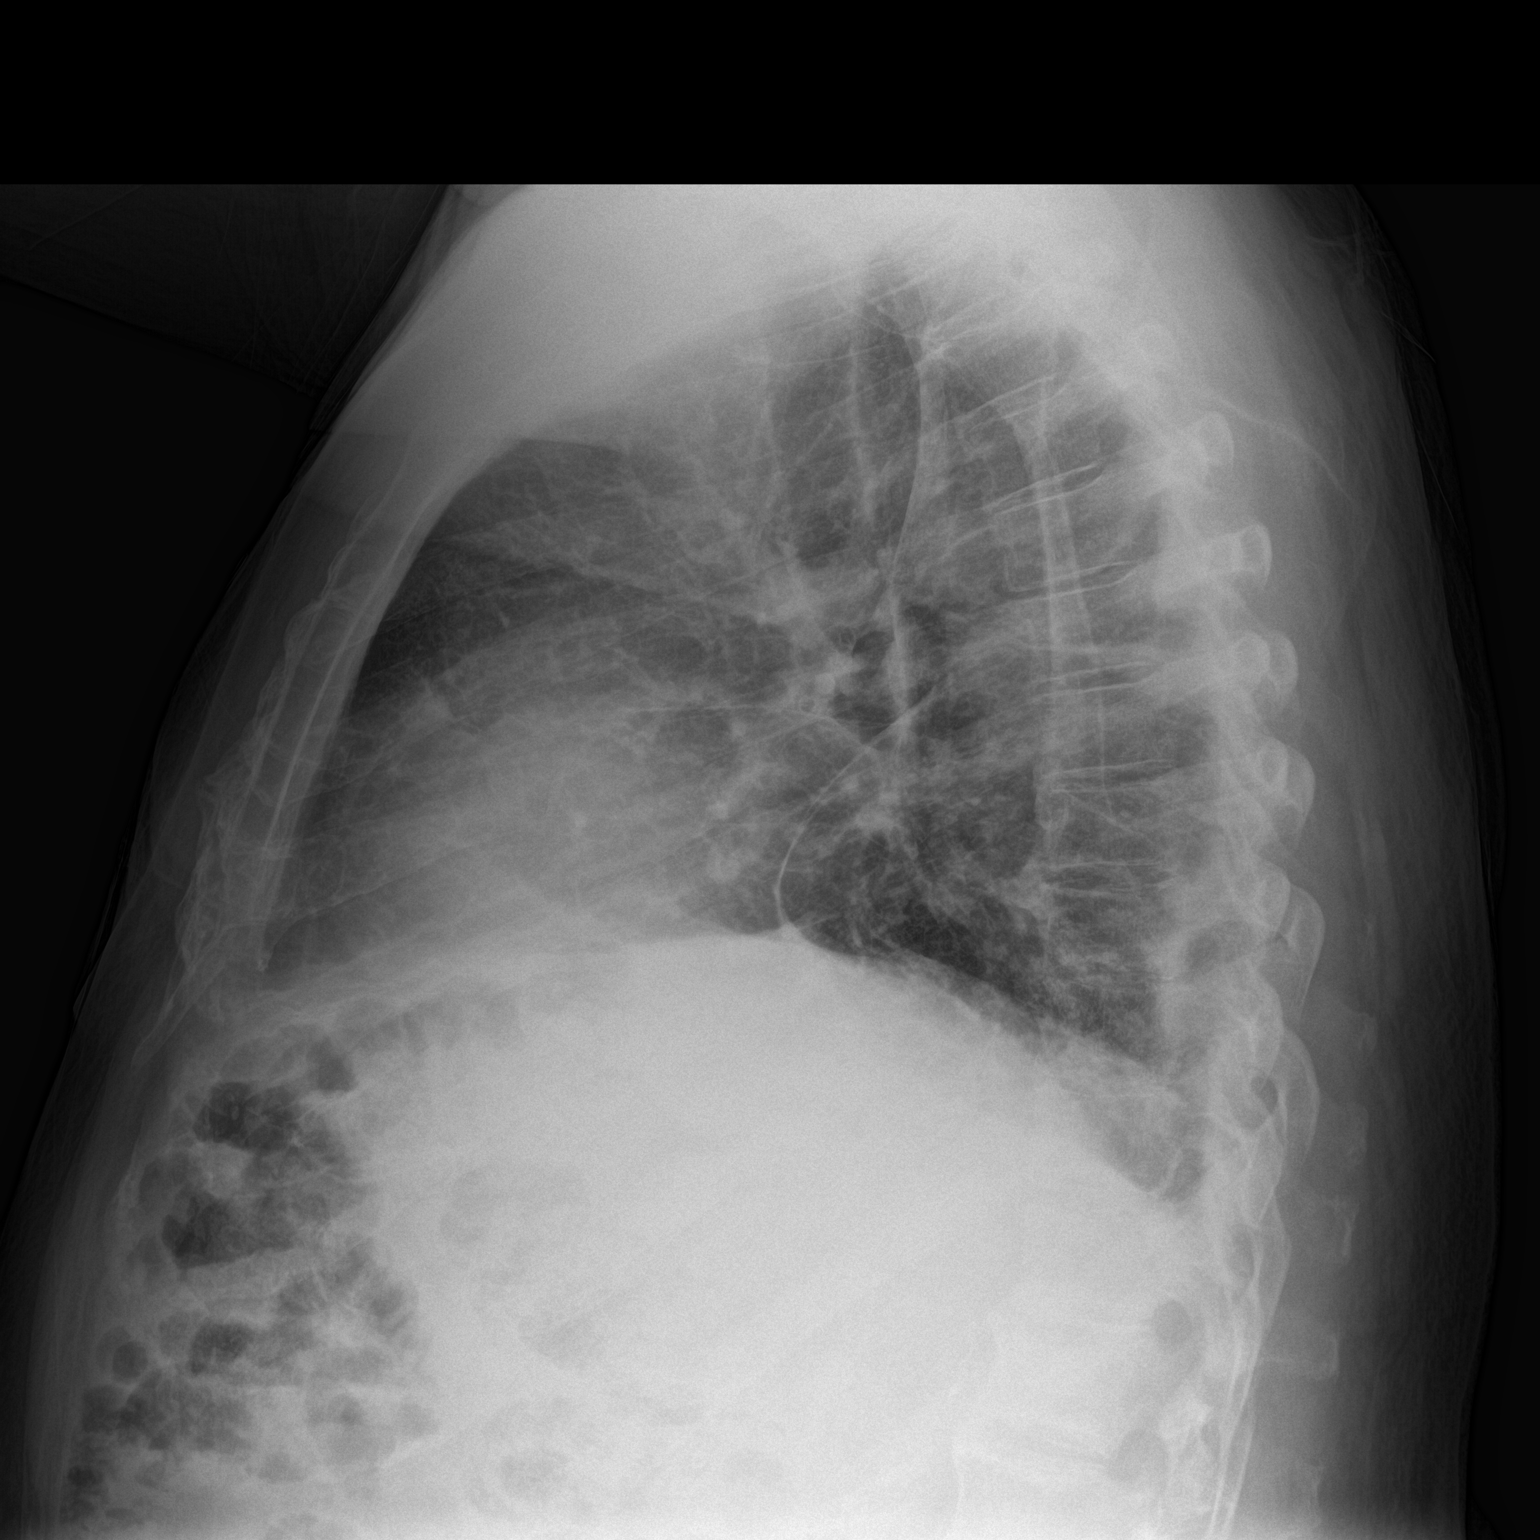

[2 of 2 positions shown; findings below may reference images not displayed]

FINDINGS: Cardiomegaly again noted. Central mild vascular congestion. Patchy
bilateral basilar atelectasis or infiltrate with slight worsening in
aeration from prior exam.
IMPRESSION: Central mild vascular congestion. Patchy bilateral basilar
atelectasis or infiltrate with slight worsening and are aeration.

## 2015-11-27 IMAGING — US US ABDOMEN COMPLETE
1 series · 13 of 25 positions shown · non-contrast
Comparison: 09/06/2013

CLINICAL DATA: Cirrhosis secondary to NASH, history hypertension,
diabetes, acute on chronic renal failure, esophageal varices, GERD

EXAM:
ULTRASOUND ABDOMEN COMPLETE

[Series 1: us abdomen complete · 0.25mm/px · 13 of 44 slices shown]
[im 1/44]
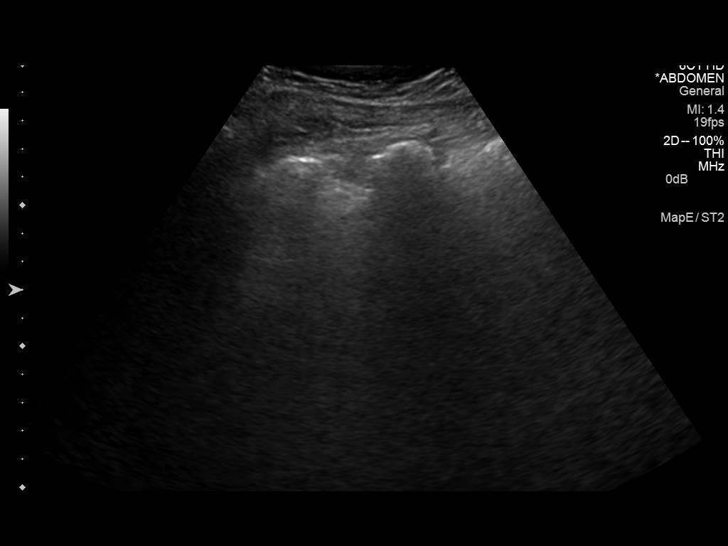
[im 4/44]
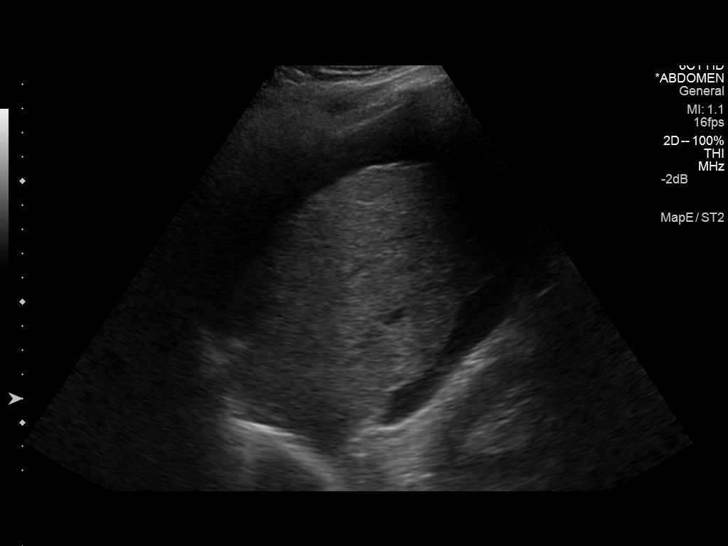
[im 8/44]
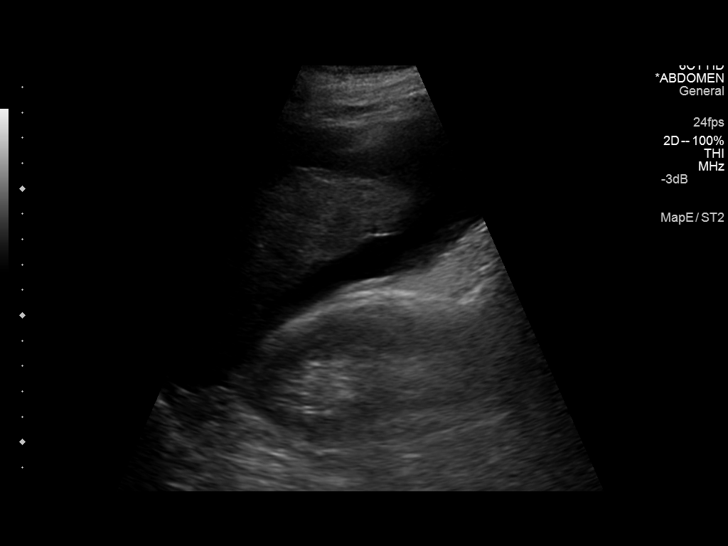
[im 11/44]
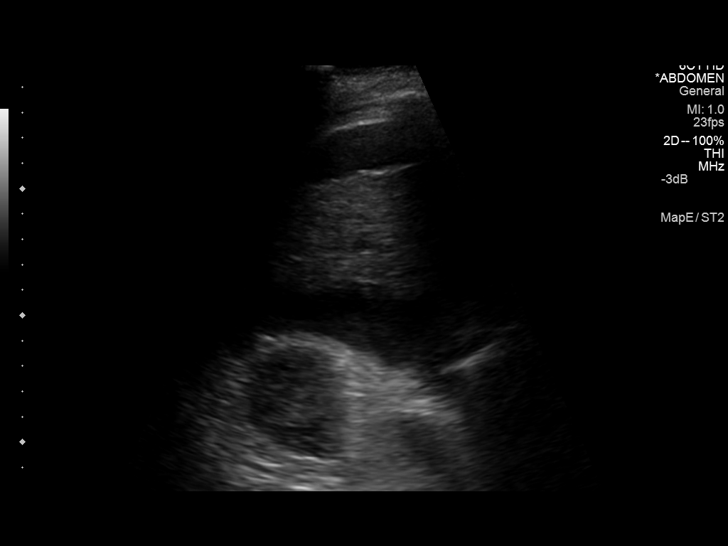
[im 15/44]
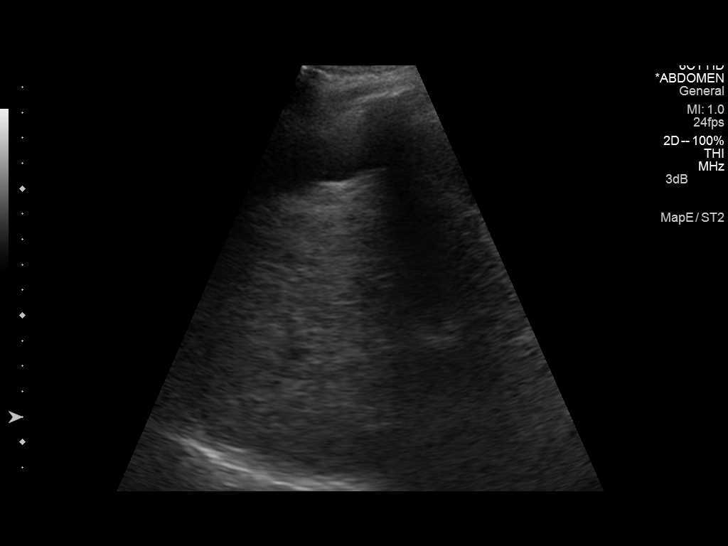
[im 18/44]
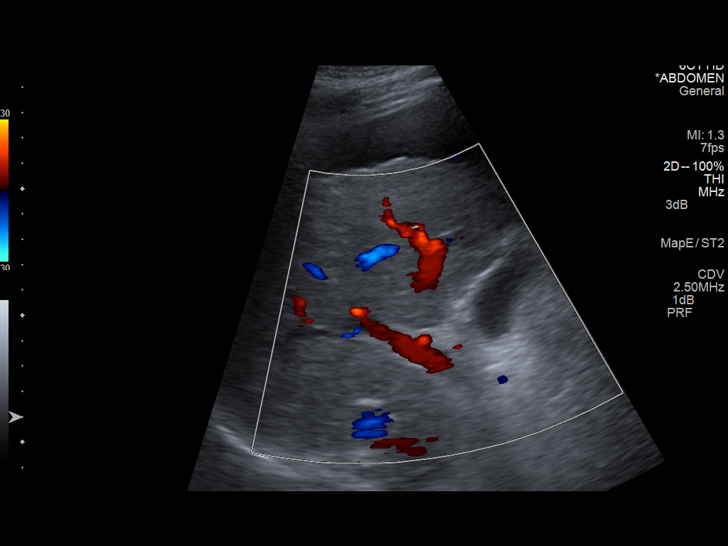
[im 22/44]
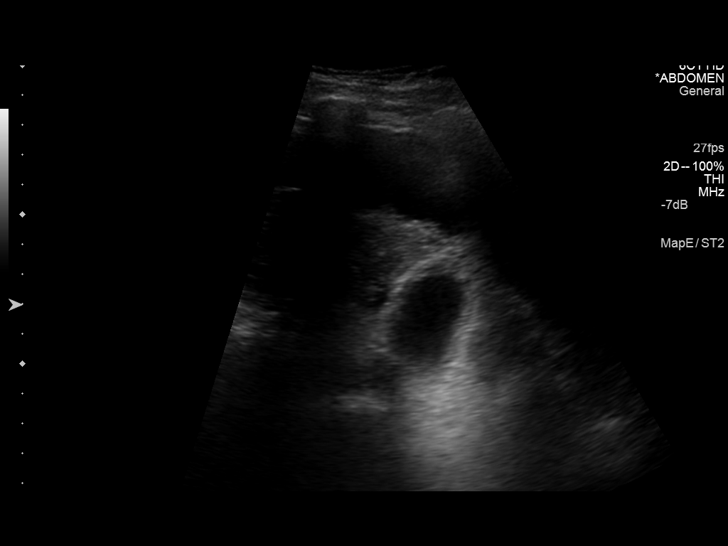
[im 26/44]
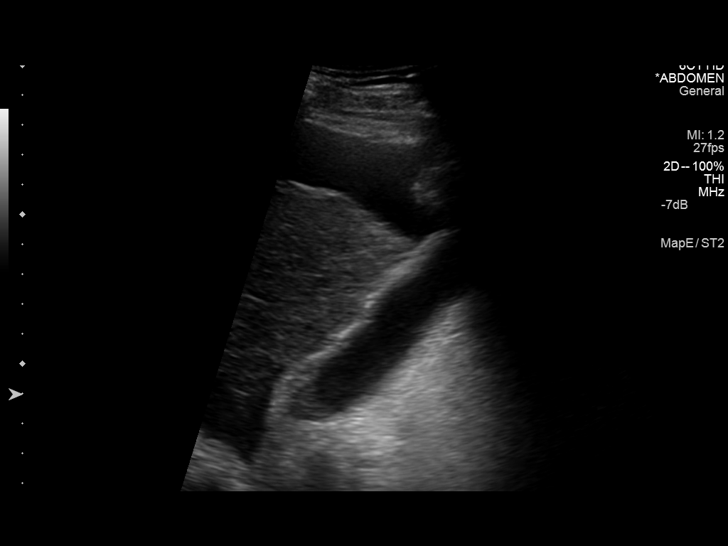
[im 29/44]
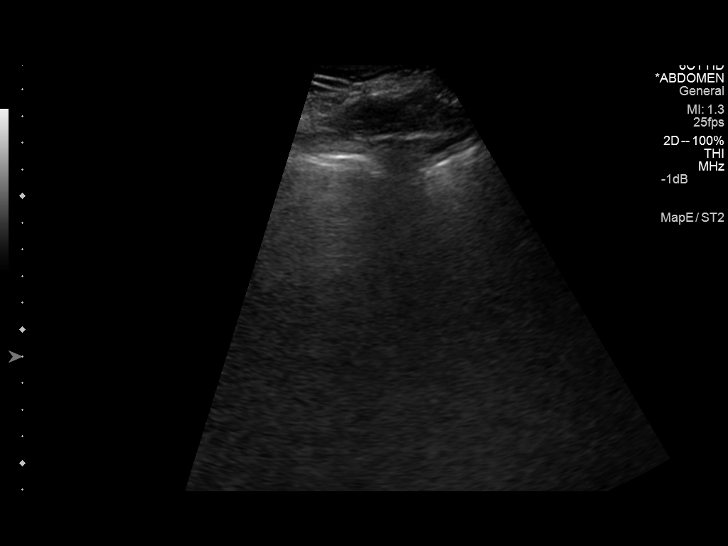
[im 33/44]
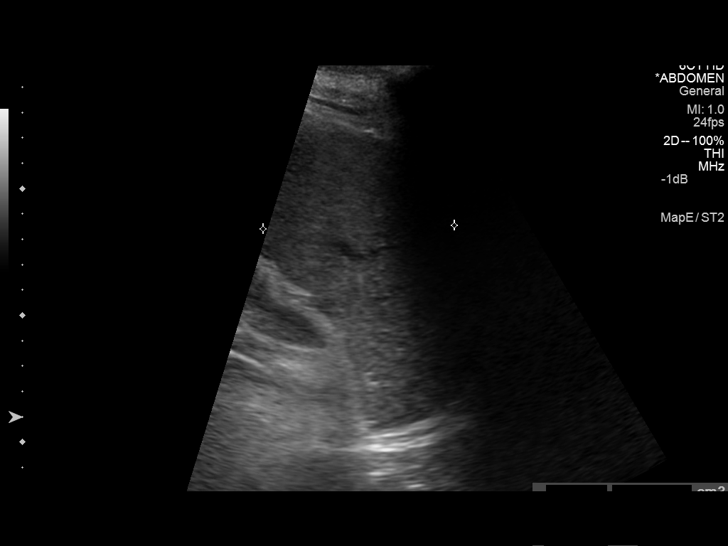
[im 36/44]
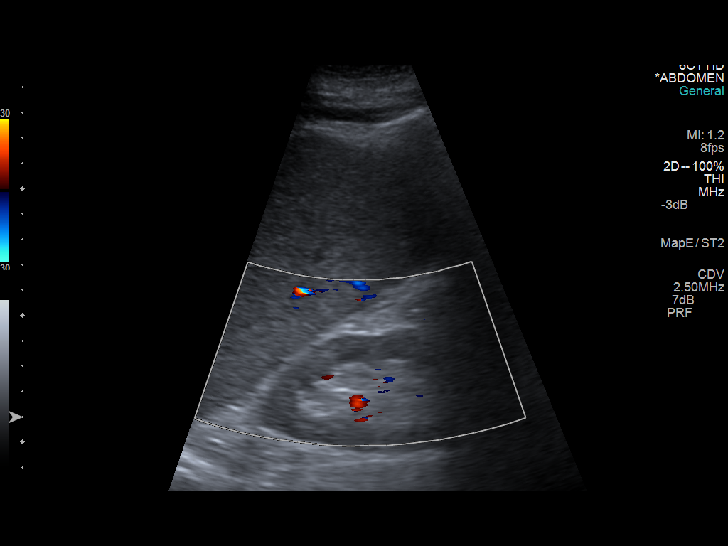
[im 40/44]
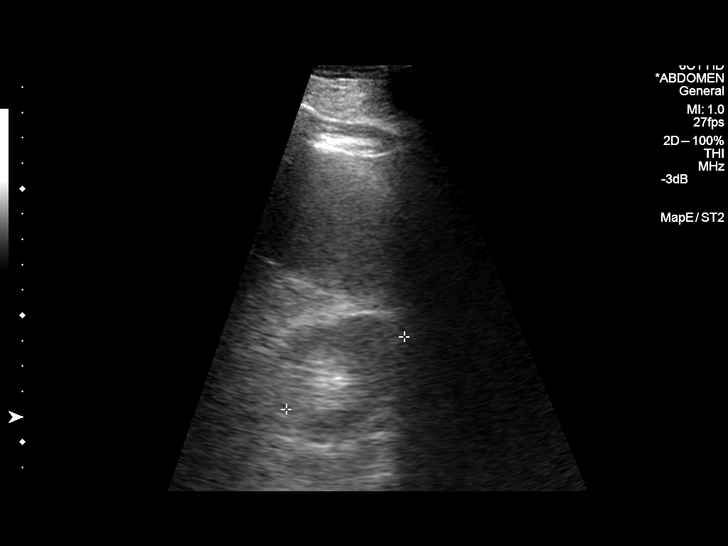
[im 44/44]
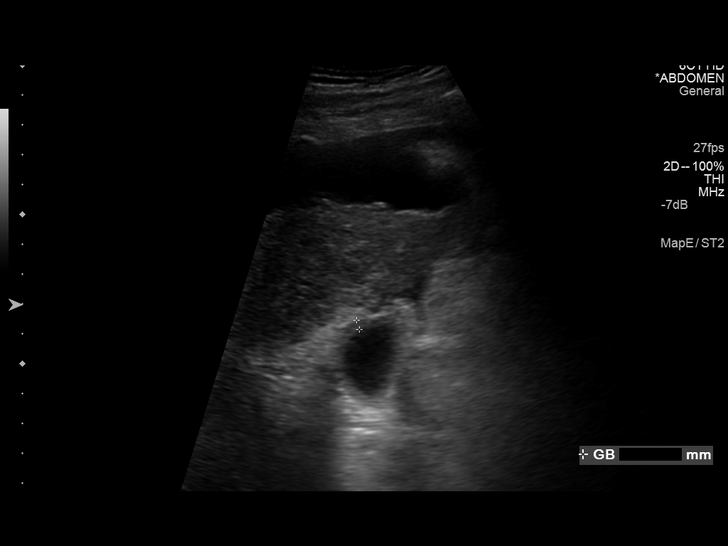

[13 of 25 positions shown; findings below may reference images not displayed]

FINDINGS: Gallbladder: Small amount of sludge within gallbladder. Upper normal
gallbladder wall thickness, nonspecific in setting of ascites. No
shadowing calculi or sonographic Murphy sign.

Common bile duct: Diameter: Normal caliber 3 mm diameter

Liver: Heterogeneous minimally coarsened echogenicity with nodular
hepatic margins consistent with cirrhosis. Hepatopetal portal venous
flow. No definite focal mass identified though portions of the LEFT
lobe are inadequately visualized due to shadowing from bowel gas.

IVC: No abnormality visualized.

Pancreas: Visualized portion unremarkable.

Spleen: Incompletely visualized due to body habitus and bowel gas.
Morphologically demonstrates rounded margins and suspect is enlarged
though unable to accurately measure. No visualized focal mass
lesion.

Right Kidney: Length: 10.5 cm. Cortical thinning. Upper normal
cortical echogenicity. No mass or hydronephrosis.

Left Kidney: Length: 10.4 cm. Age-related cortical thinning.. Upper
normal echogenicity. No mass or hydronephrosis.

Abdominal aorta: Obscured by bowel gas

Other findings: Ascites in upper abdomen.
IMPRESSION: Limited exam demonstrating cirrhotic liver, ascites and probable
splenomegaly though spleen is incompletely visualized.

No additional abnormalities identified by limited sonographic
assessment.

If better intrahepatic visualization is required such S for hepatic
cellular carcinoma screening, consider MR imaging with and without
contrast.
